# Patient Record
Sex: Female | Born: 1975 | Hispanic: No | Marital: Single | Smoking: Current every day smoker
Health system: Southern US, Community
[De-identification: ages and names within clinical notes are randomized; demographics above are authoritative.]

## PROBLEM LIST (undated history)

## (undated) DIAGNOSIS — S8262XA Displaced fracture of lateral malleolus of left fibula, initial encounter for closed fracture: Secondary | ICD-10-CM

## (undated) DIAGNOSIS — F17200 Nicotine dependence, unspecified, uncomplicated: Secondary | ICD-10-CM

## (undated) HISTORY — PX: BREAST SURGERY: SHX581

---

## 2016-10-29 ENCOUNTER — Emergency Department (HOSPITAL_COMMUNITY)
Admission: EM | Admit: 2016-10-29 | Discharge: 2016-10-29 | Disposition: A | Discharge status: Home / Self Care | Payer: Medicaid Other | Attending: Emergency Medicine | Admitting: Emergency Medicine

## 2016-10-29 ENCOUNTER — Encounter (HOSPITAL_COMMUNITY): Payer: Self-pay | Admitting: Emergency Medicine

## 2016-10-29 DIAGNOSIS — N61 Mastitis without abscess: Secondary | ICD-10-CM | POA: Insufficient documentation

## 2016-10-29 DIAGNOSIS — F17200 Nicotine dependence, unspecified, uncomplicated: Secondary | ICD-10-CM | POA: Diagnosis not present

## 2016-10-29 MED ORDER — SULFAMETHOXAZOLE-TRIMETHOPRIM 800-160 MG PO TABS
1.0000 | ORAL_TABLET | Freq: Once | ORAL | Status: AC
  Administered 2016-10-29: 1 via ORAL
  Filled 2016-10-29: qty 1

## 2016-10-29 MED ORDER — SULFAMETHOXAZOLE-TRIMETHOPRIM 800-160 MG PO TABS: 1.0000 | ORAL_TABLET | Freq: Two times a day (BID) | ORAL | 0 refills | Status: AC

## 2016-10-29 MED ORDER — CEPHALEXIN 500 MG PO CAPS: 500.0000 mg | ORAL_CAPSULE | Freq: Two times a day (BID) | ORAL | 0 refills | Status: DC

## 2016-10-29 MED ORDER — CEPHALEXIN 500 MG PO CAPS
500.0000 mg | ORAL_CAPSULE | Freq: Once | ORAL | Status: AC
  Administered 2016-10-29: 500 mg via ORAL
  Filled 2016-10-29: qty 1

## 2016-10-29 NOTE — Discharge Instructions (Signed)
PLEASE SCHEDULE A FOLLOW UP APPOINTMENT WITH [facility] AS SOON AS POSSIBLE. TAKE ALL ANTIBIOTICS AS PRESCRIBED UNTIL THEY ARE FINISHED. RETURN IMMEDIATELY FOR ANY FEVERS, WORSENING SWELLING OR PAIN.

## 2016-10-29 NOTE — ED Provider Notes (Signed)
[facility]-EMERGENCY DEPT Provider Note   CSN: [id number] Arrival date & time: 10/29/16  1114     History   Chief Complaint Chief Complaint  Patient presents with  . breast cyst    HPI Sandra Pineda is a 40 y.o. female.  40 year old female who presents with right breast pain and redness. Patient reports that 12 years ago she had a tubal ligation which she thinks caused her to have problems with cysts in her breasts. She has refused to have mammograms because she thinks the machines will cause more harm to her body. She had surgery to remove cyst on R breast 12 years ago. For the past 7-8 days, she has had warmth, redness, swelling, drainage, and pain of R breast near previous surgical incision. The swelling has improved but she continues to have moderate, constant pain. Subjective sweats and chills, no vomiting or measured fevers. She states "the molecules in my body are off and I haven't been able to heal this holistically on my own. I have taken garlic without improvement."    The history is provided by the patient.    History reviewed. No pertinent past medical history.  There are no active problems to display for this patient.   Past Surgical History:  Procedure Laterality Date  . BREAST SURGERY      OB History    No data available       Home Medications    Prior to Admission medications   Medication Sig Start Date End Date Taking? Authorizing Provider  megestrol (MEGACE) 40 MG/ML suspension Take 400 mg by mouth daily.   Yes Historical Provider, MD  cephALEXin (KEFLEX) 500 MG capsule Take 1 capsule (500 mg total) by mouth 2 (two) times daily. 10/29/16   Laurence Spatesachel Morgan Abie Cheek, MD  sulfamethoxazole-trimethoprim (BACTRIM DS,SEPTRA DS) 800-160 MG tablet Take 1 tablet by mouth 2 (two) times daily. 10/29/16 11/05/16  Laurence Spatesachel Morgan Gaylyn Berish, MD    Family History No family history on file.  Social History Social History  Substance Use Topics  . Smoking status: Current  Every Day Smoker  . Smokeless tobacco: Never Used  . Alcohol use No     Allergies   Patient has no known allergies.   Review of Systems Review of Systems 10 Systems reviewed and are negative for acute change except as noted in the HPI.   Physical Exam Updated Vital Signs BP 145/79 (BP Location: Left Arm)   Pulse 75   Temp 98.6 F (37 C)   Resp 18   LMP 10/01/2016   SpO2 97%   Physical Exam  Constitutional: She is oriented to person, place, and time. She appears well-developed and well-nourished. No distress.  HENT:  Head: Normocephalic and atraumatic.  Moist mucous membranes  Eyes: Conjunctivae are normal. Pupils are equal, round, and reactive to light.  Neck: Neck supple.  Cardiovascular: Normal rate, regular rhythm and normal heart sounds.   No murmur heard. Pulmonary/Chest: Effort normal and breath sounds normal.  Abdominal: Soft. Bowel sounds are normal. She exhibits no distension. There is no tenderness.  Musculoskeletal: She exhibits no edema.  Neurological: She is alert and oriented to person, place, and time.  Fluent speech  Skin: Skin is warm and dry. There is erythema.  Small 1cm scar on R breast areola at 9-10 o'clock position, with small area of surrounding redness, mild induration, tenderness; overlying scab on R areola with no active drainage; no fluctuance  Psychiatric:  Bizarre affect, slightly pressured speech +delusions  Nursing note  and vitals reviewed.  Chaperone was present during exam.   ED Treatments / Results  Labs (all labs ordered are listed, but only abnormal results are displayed) Labs Reviewed - No data to display  EKG  EKG Interpretation None       Radiology No results found.  Procedures Procedures (including critical care time)  Medications Ordered in ED Medications  cephALEXin (KEFLEX) capsule 500 mg (not administered)  sulfamethoxazole-trimethoprim (BACTRIM DS,SEPTRA DS) 800-160 MG per tablet 1 tablet (not  administered)     Initial Impression / Assessment and Plan / ED Course  I have reviewed the triage vital signs and the nursing notes.  Pertinent labs & imaging results that were available during my care of the patient were reviewed by me and considered in my medical decision making (see chart for details).  Clinical Course    PT w/ 1 week of R breast redness, drainage, and pain near area of previous cyst removal 12 years ago. Well-appearing, afebrile, and with reassuring vital signs on exam. Small area of erythema, scabbing from previous drainage but no active drainage on exam. No focal area of fluctuance to suggest underlying abscess. Exam is consistent with cellulitic process and I do not feel she needs drainage at this point in time. I have however recommended that she follow closely with Gen. surgery and provided her with follow-up information, especially given report of previous surgery to breast. Started on Keflex and Bactrim and emphasized importance of return precautions including any worsening swelling, drainage, or fevers. Patient voiced understanding of plan and was discharged in satisfactory condition.  Final Clinical Impressions(s) / ED Diagnoses   Final diagnoses:  Cellulitis of right breast    New Prescriptions New Prescriptions   CEPHALEXIN (KEFLEX) 500 MG CAPSULE    Take 1 capsule (500 mg total) by mouth 2 (two) times daily.   SULFAMETHOXAZOLE-TRIMETHOPRIM (BACTRIM DS,SEPTRA DS) 800-160 MG TABLET    Take 1 tablet by mouth 2 (two) times daily.     Laurence Spatesachel Morgan Latifa Noble, MD 10/29/16 [id number]

## 2016-10-29 NOTE — ED Notes (Addendum)
Patient has area of redness without swelling to right breast at approximately 10 o'clock.  Patient states area has drained from old incision site for several days, but no drainage noted currently.  Patient denies N/V, but endorses occasional chills.

## 2016-10-29 NOTE — ED Notes (Signed)
Pt. Refuse to get in a gown. Nurse aware.

## 2016-10-29 NOTE — ED Triage Notes (Signed)
Pt reports breast cyst removal 12 years ago, presents with rednss and possible infection left breast. sts painful to touch. Also reports visible drainage x 7 days.

## 2016-11-02 ENCOUNTER — Inpatient Hospital Stay: Admission: RE | Admit: 2016-11-02 | Payer: Self-pay | Source: Ambulatory Visit

## 2016-11-02 ENCOUNTER — Other Ambulatory Visit: Payer: Self-pay | Admitting: Surgery

## 2016-11-02 DIAGNOSIS — N61 Mastitis without abscess: Secondary | ICD-10-CM

## 2016-11-03 ENCOUNTER — Inpatient Hospital Stay: Admission: RE | Admit: 2016-11-03 | Payer: Self-pay | Source: Ambulatory Visit

## 2016-11-03 ENCOUNTER — Ambulatory Visit (HOSPITAL_COMMUNITY): Payer: Self-pay

## 2018-03-30 ENCOUNTER — Ambulatory Visit: Payer: Self-pay | Admitting: Obstetrics

## 2018-04-05 ENCOUNTER — Ambulatory Visit: Payer: Self-pay | Admitting: Obstetrics

## 2018-04-11 ENCOUNTER — Ambulatory Visit: Payer: Self-pay | Admitting: Obstetrics

## 2018-05-30 ENCOUNTER — Ambulatory Visit (INDEPENDENT_AMBULATORY_CARE_PROVIDER_SITE_OTHER): Payer: Medicaid Other

## 2018-05-30 ENCOUNTER — Encounter (HOSPITAL_COMMUNITY): Payer: Self-pay

## 2018-05-30 ENCOUNTER — Ambulatory Visit (HOSPITAL_COMMUNITY)
Admission: EM | Admit: 2018-05-30 | Discharge: 2018-05-30 | Disposition: A | Discharge status: Home / Self Care | Payer: Medicaid Other | Attending: Family Medicine | Admitting: Family Medicine

## 2018-05-30 DIAGNOSIS — W450XXA Nail entering through skin, initial encounter: Secondary | ICD-10-CM | POA: Diagnosis not present

## 2018-05-30 DIAGNOSIS — Z23 Encounter for immunization: Secondary | ICD-10-CM

## 2018-05-30 DIAGNOSIS — S99921A Unspecified injury of right foot, initial encounter: Secondary | ICD-10-CM

## 2018-05-30 MED ORDER — TETANUS-DIPHTH-ACELL PERTUSSIS 5-2.5-18.5 LF-MCG/0.5 IM SUSP
0.5000 mL | Freq: Once | INTRAMUSCULAR | Status: AC
  Administered 2018-05-30: 0.5 mL via INTRAMUSCULAR

## 2018-05-30 MED ORDER — DICLOFENAC SODIUM 75 MG PO TBEC: 75.0000 mg | DELAYED_RELEASE_TABLET | Freq: Two times a day (BID) | ORAL | 0 refills | Status: DC

## 2018-05-30 MED ORDER — TETANUS-DIPHTH-ACELL PERTUSSIS 5-2.5-18.5 LF-MCG/0.5 IM SUSP
INTRAMUSCULAR | Status: AC
  Filled 2018-05-30: qty 0.5

## 2018-05-30 NOTE — ED Provider Notes (Signed)
[facility]   [id number] 05/30/18 Arrival Time: 1045  ASSESSMENT & PLAN:  1. Foot injury, right, initial encounter    Imaging: Dg Foot Complete Right  Result Date: 05/30/2018 CLINICAL DATA:  Stepped on a nail 3 days ago, puncture wound to plantar aspect of foot EXAM: RIGHT FOOT COMPLETE - 3+ VIEW COMPARISON:  None FINDINGS: Osseous mineralization normal. Joint spaces preserved. No acute fracture, dislocation, or bone destruction. No radiopaque foreign bodies or soft tissue gas identified. IMPRESSION: Normal exam. Electronically Signed   By: Ulyses Southward M.D.   On: 05/30/2018 12:02    Meds ordered this encounter  Medications  . Tdap (BOOSTRIX) injection 0.5 mL  . diclofenac (VOLTAREN) 75 MG EC tablet    Sig: Take 1 tablet (75 mg total) by mouth 2 (two) times daily.    Dispense:  14 tablet    Refill:  0   Natural history and expected course discussed. Questions answered. NSAIDs per medication orders.  May f/u with her PCP or here as needed.  Reviewed expectations re: course of current medical issues. Questions answered. Outlined signs and symptoms indicating need for more acute intervention. Patient verbalized understanding. After Visit Summary given.  SUBJECTIVE: History from: patient. Gelila Well is a 42 y.o. female who reports persistent mild pain of her right plantar foot that is stable; described as aching without radiation. Onset: abrupt, two days ago. Injury/trama: yes, reports stepping on nail two days ago; through slipper; minimal bleeding. Relieved by: elevating foot. Worsened by: weight bearing. Associated symptoms: none reported. Extremity sensation changes or weakness: none. Self treatment: two left over Tramadol she had; helped History of similar: no  Td: unsure  ROS: As per HPI.   OBJECTIVE:  Vitals:   05/30/18 1130 05/30/18 1132  BP:  129/82  Pulse:  71  Resp:  20  Temp: 98.3 F (36.8 C)   TempSrc: Temporal   SpO2:  98%      General appearance: alert; no distress Extremities: warm and well perfused; symmetrical with no gross deformities; localized tenderness over her right plantar mid-foot with no swelling and no bruising; ROM: normal; no sign of infection; skin soft to palpation CV: normal extremity capillary refill Skin: warm and dry Neurologic: normal gait; normal sensation in all extremities Psychological: alert and cooperative; normal mood and affect  No Known Allergies   Social History   Socioeconomic History  . Marital status: Single    Spouse name: Not on file  . Number of children: Not on file  . Years of education: Not on file  . Highest education level: Not on file  Occupational History  . Not on file  Social Needs  . Financial resource strain: Not on file  . Food insecurity:    Worry: Not on file    Inability: Not on file  . Transportation needs:    Medical: Not on file    Non-medical: Not on file  Tobacco Use  . Smoking status: Current Every Day Smoker  . Smokeless tobacco: Never Used  Substance and Sexual Activity  . Alcohol use: No  . Drug use: Not on file  . Sexual activity: Not on file  Lifestyle  . Physical activity:    Days per week: Not on file    Minutes per session: Not on file  . Stress: Not on file  Relationships  . Social connections:    Talks on phone: Not on file    Gets together: Not on file    Attends  religious service: Not on file    Active member of club or organization: Not on file    Attends meetings of clubs or organizations: Not on file    Relationship status: Not on file  . Intimate partner violence:    Fear of current or ex partner: Not on file    Emotionally abused: Not on file    Physically abused: Not on file    Forced sexual activity: Not on file  Other Topics Concern  . Not on file  Social History Narrative  . Not on file   History reviewed. No pertinent family history. Past Surgical History:  Procedure Laterality Date  . BREAST  SURGERY        Mardella LaymanHagler, Leon Goodnow, MD 06/09/18 [phone]

## 2018-05-30 NOTE — Discharge Instructions (Addendum)
Medications  Tdap (BOOSTRIX) injection 0.5 mL    We have given you a tetanus shot today.  Take the medication prescribed with food for your foot discomfort.

## 2018-05-30 NOTE — ED Triage Notes (Signed)
Pt presents with right foot pain from stepping on a nail.

## 2018-11-10 ENCOUNTER — Emergency Department (HOSPITAL_COMMUNITY)
Admission: EM | Admit: 2018-11-10 | Discharge: 2018-11-10 | Disposition: A | Discharge status: Home / Self Care | Payer: Medicaid Other | Attending: Emergency Medicine | Admitting: Emergency Medicine

## 2018-11-10 ENCOUNTER — Encounter (HOSPITAL_COMMUNITY): Payer: Self-pay | Admitting: Emergency Medicine

## 2018-11-10 ENCOUNTER — Emergency Department (HOSPITAL_COMMUNITY): Payer: Medicaid Other

## 2018-11-10 ENCOUNTER — Other Ambulatory Visit: Payer: Self-pay

## 2018-11-10 DIAGNOSIS — F17200 Nicotine dependence, unspecified, uncomplicated: Secondary | ICD-10-CM | POA: Diagnosis not present

## 2018-11-10 DIAGNOSIS — S82832A Other fracture of upper and lower end of left fibula, initial encounter for closed fracture: Secondary | ICD-10-CM | POA: Insufficient documentation

## 2018-11-10 DIAGNOSIS — Y999 Unspecified external cause status: Secondary | ICD-10-CM | POA: Diagnosis not present

## 2018-11-10 DIAGNOSIS — Z79899 Other long term (current) drug therapy: Secondary | ICD-10-CM | POA: Insufficient documentation

## 2018-11-10 DIAGNOSIS — W108XXA Fall (on) (from) other stairs and steps, initial encounter: Secondary | ICD-10-CM | POA: Diagnosis not present

## 2018-11-10 DIAGNOSIS — Y92008 Other place in unspecified non-institutional (private) residence as the place of occurrence of the external cause: Secondary | ICD-10-CM | POA: Diagnosis not present

## 2018-11-10 DIAGNOSIS — W19XXXA Unspecified fall, initial encounter: Secondary | ICD-10-CM

## 2018-11-10 DIAGNOSIS — S8992XA Unspecified injury of left lower leg, initial encounter: Secondary | ICD-10-CM | POA: Diagnosis present

## 2018-11-10 DIAGNOSIS — Y9301 Activity, walking, marching and hiking: Secondary | ICD-10-CM | POA: Insufficient documentation

## 2018-11-10 MED ORDER — OXYCODONE-ACETAMINOPHEN 5-325 MG PO TABS
1.0000 | ORAL_TABLET | Freq: Once | ORAL | Status: DC
  Filled 2018-11-10: qty 1

## 2018-11-10 MED ORDER — OXYCODONE-ACETAMINOPHEN 5-325 MG PO TABS: 1.0000 | ORAL_TABLET | Freq: Four times a day (QID) | ORAL | 0 refills | Status: DC | PRN

## 2018-11-10 NOTE — ED Triage Notes (Signed)
Per EMS, patient from home, reports trip and fall yesterday. C/o left ankle pain. Swelling noted to ankle.

## 2018-11-10 NOTE — ED Provider Notes (Signed)
[facility]-EMERGENCY DEPT Provider Note   CSN: [id number] Arrival date & time: 11/10/18  1515     History   Chief Complaint Chief Complaint  Patient presents with  . Ankle Pain    HPI Sandra Pineda is a 42 y.o. female with a history of tobacco abuse presents to the emergency department via EMS status post mechanical fall last evening with complaints of left ankle pain primarily.  Patient states that she was walking down her porch and missed the last step resulting in a fall.  She believes that she rolled the left ankle.  She did fall to the ground but did not hit her head or have any loss of consciousness.  States she is having pain to bilateral ankles, most prominently to the left ankle w/ distal lower leg pain and foot pain to the LLE as well.  She states that she has been putting her all her weight on the right foot secondary to pain in the LLE.  She has had associated swelling.  Current discomfort is severe.  She took 800 mg ibuprofen without relief.  She denies numbness, tingling, weakness, or other areas of injury.  HPI  History reviewed. No pertinent past medical history.  There are no active problems to display for this patient.   Past Surgical History:  Procedure Laterality Date  . BREAST SURGERY       OB History   No obstetric history on file.      Home Medications    Prior to Admission medications   Medication Sig Start Date End Date Taking? Authorizing Provider  cephALEXin (KEFLEX) 500 MG capsule Take 1 capsule (500 mg total) by mouth 2 (two) times daily. 10/29/16   Little, Ambrose Finland, MD  diclofenac (VOLTAREN) 75 MG EC tablet Take 1 tablet (75 mg total) by mouth 2 (two) times daily. 05/30/18   Mardella Layman, MD  megestrol (MEGACE) 40 MG/ML suspension Take 400 mg by mouth daily.    [provider]    Family History No family history on file.  Social History Social History   Tobacco Use  . Smoking status: Current  Every Day Smoker  . Smokeless tobacco: Never Used  Substance Use Topics  . Alcohol use: No  . Drug use: Not on file     Allergies   Patient has no known allergies.   Review of Systems Review of Systems  Constitutional: Negative for chills and fever.  Musculoskeletal: Positive for arthralgias (ankles bilaterally) and joint swelling (ankles bilaterally). Negative for back pain and neck pain.  Skin: Negative for wound.  Neurological: Negative for weakness and numbness.  All other systems reviewed and are negative.    Physical Exam Updated Vital Signs There were no vitals taken for this visit.  Physical Exam Vitals signs and nursing note reviewed.  Constitutional:      General: She is not in acute distress.    Appearance: She is well-developed. She is not toxic-appearing.  HENT:     Head: Normocephalic and atraumatic. No raccoon eyes or Battle's sign.  Eyes:     General:        Right eye: No discharge.        Left eye: No discharge.     Conjunctiva/sclera: Conjunctivae normal.  Neck:     Musculoskeletal: Normal range of motion and neck supple. No spinous process tenderness.  Cardiovascular:     Rate and Rhythm: Normal rate and regular rhythm.     Pulses:  Dorsalis pedis pulses are 2+ on the right side and 2+ on the left side.       Posterior tibial pulses are 2+ on the right side and 2+ on the left side.  Pulmonary:     Effort: Pulmonary effort is normal. No respiratory distress.     Breath sounds: Normal breath sounds. No wheezing, rhonchi or rales.  Abdominal:     General: There is no distension.     Palpations: Abdomen is soft.     Tenderness: There is no abdominal tenderness.  Musculoskeletal:     Comments: Upper extremities: Normal range of motion.  Nontender Back: No midline tenderness or palpable step-off Lower extremities: Patient has significant soft tissue swelling to the left ankle which extends to the distal third of the lower leg and into the  midfoot.  The area is mildly erythematous.  It is not hot to touch.  No significant ecchymosis or open wounds.  Right ankle with mild soft tissue swelling over the lateral aspect of the ankle.  Patient has normal active range of motion to the hips and knees.  She has normal active range of motion to the right ankle.  Left ankle range of motion is limited secondary to pain, able to minimally dorsi/plantar flex.  LLE: Patient is tender over the lateral distal third of the lower leg, lateral malleolus, lateral ankle medical ligaments, the medial malleolus and medial ankle ligaments, as well as the midfoot.  Otherwise nontender.  No fibular head tenderness.  Neurovascularly intact distally. RLE: Tender over the lateral malleolus and lateral ankle ligaments.  No other tenderness noted.  No tenderness at the base the fifth, navicular bone, or the fibular head.  Skin:    General: Skin is warm and dry.     Findings: No rash.  Neurological:     Mental Status: She is alert.     Comments: Clear speech.  Sensation grossly intact bilateral lower extremities.  Right ankle plantar/dorsiflexion with good strength, left ankle difficult to evaluate secondary to pain.  Psychiatric:        Behavior: Behavior normal.      ED Treatments / Results  Labs (all labs ordered are listed, but only abnormal results are displayed) Labs Reviewed - No data to display  EKG None  Radiology Dg Tibia/fibula Left  Result Date: 11/10/2018 CLINICAL DATA:  Larey SeatFell yesterday with pain and swelling EXAM: LEFT TIBIA AND FIBULA - 2 VIEW COMPARISON:  None. FINDINGS: The left tibia is intact and normally aligned. However, there is an oblique fracture of the distal left fibula which is minimally displaced. IMPRESSION: Minimally displaced oblique fracture of the distal left fibula. Electronically Signed   By: Dwyane DeePaul  Barry M.D.   On: 11/10/2018 17:10   Dg Ankle Complete Left  Result Date: 11/10/2018 CLINICAL DATA:  Larey SeatFell yesterday with pain  and swelling EXAM: LEFT ANKLE COMPLETE - 3+ VIEW COMPARISON:  None. FINDINGS: There is an oblique minimally displaced fracture of the distal left fibula with adjacent soft tissue swelling present. On the images obtained, it is difficult to exclude a fracture of the posterior malleolus. CT of the left ankle may be helpful if further assessment is warranted. The calcaneus appears intact. IMPRESSION: 1. Minimally displaced oblique fracture of the distal left fibula. 2. Can not exclude nondisplaced fracture of the posterior malleolus. Consider CT of the ankle if warranted. Electronically Signed   By: Dwyane DeePaul  Barry M.D.   On: 11/10/2018 17:12   Dg Ankle Complete Right  Result Date: 11/10/2018 CLINICAL DATA:  Larey Seat yesterday with ankle pain and swelling EXAM: RIGHT ANKLE - COMPLETE 3+ VIEW COMPARISON:  None. FINDINGS: The right ankle joint appears normal with normal alignment and normal tibiotalar articulation. No acute fracture is seen. No significant soft tissue swelling is noted. IMPRESSION: Negative. Electronically Signed   By: Dwyane Dee M.D.   On: 11/10/2018 17:09   Dg Foot Complete Left  Result Date: 11/10/2018 CLINICAL DATA:  42 y.o female. Per EMS, patient from home, reports trip and fall yesterday after missing a step. C/o left and right ankle pain. Swelling noted to left ankle and bruising noted to lateral right malleolus swelling EXAM: LEFT FOOT - COMPLETE 3+ VIEW COMPARISON:  None. FINDINGS: No fracture or dislocation of mid foot or forefoot. The phalanges are normal. The calcaneus is normal. No soft tissue abnormality. Fracture of the distal fibula. IMPRESSION: 1. No foot fracture. 2. Distal fibular fracture.  See ankle film dictation. Electronically Signed   By: Genevive Bi M.D.   On: 11/10/2018 17:09    Procedures Procedures (including critical care time)  SPLINT APPLICATION Date/Time: 6:34 PM Authorized by: Harvie Heck Consent: Verbal consent obtained. Risks and benefits:  risks, benefits and alternatives were discussed Consent given by: patient Splint applied by: orthopedic technician Location details: LLE Splint type: short leg splint Post-procedure: The splinted body part was neurovascularly unchanged following the procedure. Patient tolerance: Patient tolerated the procedure well with no immediate complications.  SPLINT APPLICATION Date/Time: 6:34 PM Authorized by: Harvie Heck Consent: Verbal consent obtained. Risks and benefits: risks, benefits and alternatives were discussed Consent given by: patient Splint applied by: orthopedic technician Location details: RLE Splint type: ASO Post-procedure: The splinted body part was neurovascularly unchanged following the procedure. Patient tolerance: Patient tolerated the procedure well with no immediate complications.    Medications Ordered in ED Medications - No data to display  Initial Impression / Assessment and Plan / ED Course  I have reviewed the triage vital signs and the nursing notes.  Pertinent labs & imaging results that were available during my care of the patient were reviewed by me and considered in my medical decision making (see chart for details).   Patient presents to the ED s/p mechanical fall last night w/ complaints of bilateral ankle pain L significantly > R. Nontoxic, vitals WNL. No evidence of serious head/neck/back injury related to fall. Imaging obtained in areas of tenderness.  RLE ankle xray negative- will place in ASO for support. LLE films with minimally displaced oblique fx of the distal fibula, cannot exclude nondisplaced posterior malleolus fracture. Per radiology consider CT of ankle if warranted. Patient NVI distally in both lower extremities. Will further discuss with orthopedics.   18:25: CONSULT: Discussed case with orthopedic surgeon Dr. Roda Shutters- instructs CT not necessary, short leg splint, non weightbearing- patient to call office tomorrow AM for follow up and  planning of surgical repair.   Plan followed through as discussed. Patient has 800 mg ibuprofen at home I recommend she continue this. Will give short course of percocet for pain. [place] Controlled Substance reporting System queried. PRICE. I discussed results, treatment plan, need for follow-up, and return precautions with the patient. Provided opportunity for questions, patient confirmed understanding and is in agreement with plan.   Findings and plan of care discussed with supervising physician Dr. Effie Shy who is in agreement.    Final Clinical Impressions(s) / ED Diagnoses   Final diagnoses:  Closed fracture of distal end of left fibula,  unspecified fracture morphology, initial encounter  Fall, initial encounter    ED Discharge Orders         Ordered    oxyCODONE-acetaminophen (PERCOCET/ROXICET) 5-325 MG tablet  Every 6 hours PRN     11/10/18 1841           Cherly Anderson, PA-C 11/10/18 1928    Mancel Bale, MD 11/11/18 1302

## 2018-11-10 NOTE — Discharge Instructions (Signed)
Please read and follow all provided instructions.  You have been seen today for a fall with ankle pain.  Your right ankle x-ray was normal, we have given you a brace to help stabilize this as it may be sprain.  Your left leg showed that you have a fracture in your fibula (1 of the bones in the lower leg).  Placed the left leg into a splint, you need to remain in the splint and keep it dry until you have followed up with orthopedics.  You are not to bear weight on the left leg due to the fracture.  Please use the crutches provided.  Please call Dr. Roda ShuttersXu to make an appointment within the next 3 days for further management and reevaluation.  Home care instructions: -- *PRICE in the first 24-48 hours after injury: Protect (with brace, splint, sling), if given by your provider Rest Ice- Do not apply ice pack directly to your splint, place towel or similar between your splint and ice/ice pack. Apply ice for 20 min, then remove for 40 min while awake Compression- Wear brace, elastic bandage, splint as directed by your provider Elevate affected extremity above the level of your heart when not walking around for the first 24-48 hours   Medications:  Please take your 800 mg ibuprofen at home continuously to help with pain and swelling.  Take Percocet for severe pain not alleviated by ibuprofen. -Percocet-this is a narcotic/controlled substance medication that has potential addicting qualities.  We recommend that you take 1-2 tablets every 6 hours as needed for severe pain.  Do not drive or operate heavy machinery when taking this medicine as it can be sedating. Do not drink alcohol or take other sedating medications when taking this medicine for safety reasons.  Keep this out of reach of small children.  Please be aware this medicine has Tylenol in it (325 mg/tab) do not exceed the maximum dose of Tylenol in a day per over the counter recommendations should you decide to supplement with Tylenol over the counter.    We have prescribed you new medication(s) today. Discuss the medications prescribed today with your pharmacist as they can have adverse effects and interactions with your other medicines including over the counter and prescribed medications. Seek medical evaluation if you start to experience new or abnormal symptoms after taking one of these medicines, seek care immediately if you start to experience difficulty breathing, feeling of your throat closing, facial swelling, or rash as these could be indications of a more serious allergic reaction   Follow-up instructions: Follow up with Dr. Warren DanesXu's office- call tomorrow morning to make an appointment for within the next 3 days.   Return instructions:  Please return if your digits or extremity are numb or tingling, appear gray or blue, or you have severe pain (also elevate the extremity and loosen splint or wrap if you were given one) Please return if you have redness or fevers.  Please return to the Emergency Department if you experience worsening symptoms.  Please return if you have any other emergent concerns. Additional Information:  Your vital signs today were: BP (!) 151/73 (BP Location: Left Arm)    Pulse 72    Temp 98.2 F (36.8 C) (Oral)    Resp 19    SpO2 95%  If your blood pressure (BP) was elevated above 135/85 this visit, please have this repeated by your doctor within one month. ---------------

## 2018-11-10 NOTE — ED Notes (Signed)
Bed: WTR6 Expected date:  Expected time:  Means of arrival:  Comments: EMS 42yo fall last night ankle pain

## 2018-11-10 NOTE — ED Notes (Signed)
Patient refusing pain medication at this time. Requesting to take medication with food. Awaiting XR results.

## 2018-11-10 NOTE — ED Notes (Signed)
Patient transported to X-ray 

## 2018-11-10 NOTE — ED Notes (Signed)
Patient refused pain medication. Patient given crackers and offered sandwich to take with medicine. Patient reports "I have them at home, I will just take one when I get home."

## 2018-11-10 NOTE — ED Notes (Signed)
Patient provided with female urinal. 

## 2018-11-15 ENCOUNTER — Ambulatory Visit (INDEPENDENT_AMBULATORY_CARE_PROVIDER_SITE_OTHER): Payer: Medicaid Other | Admitting: Orthopaedic Surgery

## 2018-11-15 ENCOUNTER — Other Ambulatory Visit: Payer: Self-pay

## 2018-11-15 ENCOUNTER — Encounter (HOSPITAL_BASED_OUTPATIENT_CLINIC_OR_DEPARTMENT_OTHER): Payer: Self-pay | Admitting: *Deleted

## 2018-11-15 DIAGNOSIS — S8262XA Displaced fracture of lateral malleolus of left fibula, initial encounter for closed fracture: Secondary | ICD-10-CM

## 2018-11-15 MED ORDER — METHOCARBAMOL 500 MG PO TABS: 500.0000 mg | ORAL_TABLET | Freq: Four times a day (QID) | ORAL | 2 refills | Status: AC | PRN

## 2018-11-15 NOTE — Progress Notes (Signed)
Office Visit Note   Patient: Sandra Pineda           Date of Birth: 1976-05-04           MRN: [id number] Visit Date: 11/15/2018              Requested by: [facility] [address] [place], [place] [address] PCP: [facility]   Assessment & Plan: Visit Diagnoses:  1. Closed displaced fracture of lateral malleolus of left fibula, initial encounter     Plan: Impression is displaced left Weber C ankle fracture.  These x-rays were reviewed with the patient and recommendations for surgical stabilization and evaluation of her syndesmosis.  We discussed the risks and benefits and rehab and recovery today.  She agrees to proceed with surgical treatment of her injury.  Today we placed her back into the splint she is to keep this elevated.  She denies a history of DVT.  Follow-Up Instructions: Return for 2 week postop visit.   Orders:  No orders of the defined types were placed in this encounter.  Meds ordered this encounter  Medications  . methocarbamol (ROBAXIN) 500 MG tablet    Sig: Take 1 tablet (500 mg total) by mouth every 6 (six) hours as needed for muscle spasms.    Dispense:  30 tablet    Refill:  2      Procedures: No procedures performed   Clinical Data: No additional findings.   Subjective: Chief Complaint  Patient presents with  . Left Ankle - Fracture    Sandra Pineda is a 42 year old female comes in today for an acute injury to her left ankle that she sustained on 11/10/2018.  She was at home when she missed a step and twisted her ankle.  She presented to the ED and x-rays demonstrated a displaced Weber C fibula fracture.  She comes in today for further evaluation and treatment.  She has been taking oxycodone ibuprofen for the pain and swelling.  Denies any numbness and tingling.  She comes in today with her daughter.   Review of Systems  Constitutional: Negative.   HENT: Negative.   Eyes: Negative.   Respiratory: Negative.   Cardiovascular:  Negative.   Endocrine: Negative.   Musculoskeletal: Negative.   Neurological: Negative.   Hematological: Negative.   Psychiatric/Behavioral: Negative.   All other systems reviewed and are negative.    Objective: Vital Signs: There were no vitals taken for this visit.  Physical Exam Vitals signs and nursing note reviewed.  Constitutional:      Appearance: She is well-developed.  HENT:     Head: Normocephalic and atraumatic.  Neck:     Musculoskeletal: Neck supple.  Pulmonary:     Effort: Pulmonary effort is normal.  Abdominal:     Palpations: Abdomen is soft.  Skin:    General: Skin is warm.     Capillary Refill: Capillary refill takes less than 2 seconds.  Neurological:     Mental Status: She is alert and oriented to person, place, and time.  Psychiatric:        Behavior: Behavior normal.        Thought Content: Thought content normal.        Judgment: Judgment normal.     Ortho Exam Left foot and ankle exam shows mild to moderate swelling with some mild bruising.  No neurovascular compromise.  The skin wrinkles with pinching.  She has strong distal pulses. Specialty Comments:  No specialty comments available.  Imaging: No results found.   PMFS History: Patient Active Problem List   Diagnosis Date Noted  . Closed fracture of left lateral malleolus 11/15/2018   No past medical history on file.  No family history on file.  Past Surgical History:  Procedure Laterality Date  . BREAST SURGERY     Social History   Occupational History  . Not on file  Tobacco Use  . Smoking status: Current Every Day Smoker  . Smokeless tobacco: Never Used  Substance and Sexual Activity  . Alcohol use: No  . Drug use: Not on file  . Sexual activity: Not on file

## 2018-11-16 ENCOUNTER — Other Ambulatory Visit: Payer: Self-pay

## 2018-11-16 ENCOUNTER — Ambulatory Visit (HOSPITAL_BASED_OUTPATIENT_CLINIC_OR_DEPARTMENT_OTHER): Payer: Medicaid Other | Admitting: Anesthesiology

## 2018-11-16 ENCOUNTER — Encounter (HOSPITAL_BASED_OUTPATIENT_CLINIC_OR_DEPARTMENT_OTHER)
Admission: RE | Disposition: A | Discharge status: Home / Self Care | Payer: Self-pay | Source: Home / Self Care | Attending: Orthopaedic Surgery

## 2018-11-16 ENCOUNTER — Encounter (HOSPITAL_BASED_OUTPATIENT_CLINIC_OR_DEPARTMENT_OTHER): Payer: Self-pay | Admitting: Certified Registered"

## 2018-11-16 ENCOUNTER — Ambulatory Visit (HOSPITAL_BASED_OUTPATIENT_CLINIC_OR_DEPARTMENT_OTHER)
Admission: RE | Admit: 2018-11-16 | Discharge: 2018-11-16 | Disposition: A | Discharge status: Home / Self Care | Payer: Medicaid Other | Attending: Orthopaedic Surgery | Admitting: Orthopaedic Surgery

## 2018-11-16 ENCOUNTER — Ambulatory Visit (HOSPITAL_COMMUNITY): Payer: Medicaid Other

## 2018-11-16 DIAGNOSIS — X58XXXA Exposure to other specified factors, initial encounter: Secondary | ICD-10-CM | POA: Diagnosis not present

## 2018-11-16 DIAGNOSIS — F17210 Nicotine dependence, cigarettes, uncomplicated: Secondary | ICD-10-CM | POA: Diagnosis not present

## 2018-11-16 DIAGNOSIS — Z419 Encounter for procedure for purposes other than remedying health state, unspecified: Secondary | ICD-10-CM

## 2018-11-16 DIAGNOSIS — S8262XA Displaced fracture of lateral malleolus of left fibula, initial encounter for closed fracture: Secondary | ICD-10-CM | POA: Diagnosis present

## 2018-11-16 HISTORY — DX: Nicotine dependence, unspecified, uncomplicated: F17.200

## 2018-11-16 HISTORY — PX: ORIF ANKLE FRACTURE: SHX5408

## 2018-11-16 HISTORY — DX: Displaced fracture of lateral malleolus of left fibula, initial encounter for closed fracture: S82.62XA

## 2018-11-16 LAB — POCT PREGNANCY, URINE: Preg Test, Ur: NEGATIVE

## 2018-11-16 SURGERY — OPEN REDUCTION INTERNAL FIXATION (ORIF) ANKLE FRACTURE
Anesthesia: Regional | Site: Ankle | Laterality: Left

## 2018-11-16 MED ORDER — ONDANSETRON HCL 4 MG/2ML IJ SOLN
INTRAMUSCULAR | Status: DC | PRN
  Administered 2018-11-16: 4 mg via INTRAVENOUS

## 2018-11-16 MED ORDER — SCOPOLAMINE 1 MG/3DAYS TD PT72: 1.0000 | MEDICATED_PATCH | Freq: Once | TRANSDERMAL | Status: DC | PRN

## 2018-11-16 MED ORDER — DEXAMETHASONE SODIUM PHOSPHATE 10 MG/ML IJ SOLN
INTRAMUSCULAR | Status: DC | PRN
  Administered 2018-11-16: 10 mg via INTRAVENOUS

## 2018-11-16 MED ORDER — CEFAZOLIN SODIUM-DEXTROSE 2-4 GM/100ML-% IV SOLN
INTRAVENOUS | Status: AC
  Filled 2018-11-16: qty 100

## 2018-11-16 MED ORDER — LIDOCAINE HCL (CARDIAC) PF 100 MG/5ML IV SOSY
PREFILLED_SYRINGE | INTRAVENOUS | Status: DC | PRN
  Administered 2018-11-16: 30 mg via INTRAVENOUS

## 2018-11-16 MED ORDER — MEPERIDINE HCL 25 MG/ML IJ SOLN: 6.2500 mg | INTRAMUSCULAR | Status: DC | PRN

## 2018-11-16 MED ORDER — CEFAZOLIN SODIUM-DEXTROSE 2-4 GM/100ML-% IV SOLN
2.0000 g | INTRAVENOUS | Status: AC
  Administered 2018-11-16: 2 g via INTRAVENOUS

## 2018-11-16 MED ORDER — FENTANYL CITRATE (PF) 100 MCG/2ML IJ SOLN
INTRAMUSCULAR | Status: AC
  Filled 2018-11-16: qty 2

## 2018-11-16 MED ORDER — PROPOFOL 500 MG/50ML IV EMUL
INTRAVENOUS | Status: DC | PRN
  Administered 2018-11-16: 25 ug/kg/min via INTRAVENOUS

## 2018-11-16 MED ORDER — MIDAZOLAM HCL 2 MG/2ML IJ SOLN
1.0000 mg | INTRAMUSCULAR | Status: DC | PRN
  Administered 2018-11-16 (×2): 2 mg via INTRAVENOUS

## 2018-11-16 MED ORDER — FENTANYL CITRATE (PF) 100 MCG/2ML IJ SOLN
50.0000 ug | INTRAMUSCULAR | Status: DC | PRN
  Administered 2018-11-16 (×2): 50 ug via INTRAVENOUS

## 2018-11-16 MED ORDER — PROMETHAZINE HCL 25 MG PO TABS: 25.0000 mg | ORAL_TABLET | Freq: Four times a day (QID) | ORAL | 1 refills | Status: DC | PRN

## 2018-11-16 MED ORDER — PROMETHAZINE HCL 25 MG/ML IJ SOLN: 6.2500 mg | INTRAMUSCULAR | Status: DC | PRN

## 2018-11-16 MED ORDER — CHLORHEXIDINE GLUCONATE 4 % EX LIQD: 60.0000 mL | Freq: Once | CUTANEOUS | Status: DC

## 2018-11-16 MED ORDER — ROPIVACAINE HCL 7.5 MG/ML IJ SOLN
INTRAMUSCULAR | Status: DC | PRN
  Administered 2018-11-16: 15 mL via PERINEURAL
  Administered 2018-11-16: 25 mL via PERINEURAL

## 2018-11-16 MED ORDER — LACTATED RINGERS IV SOLN
INTRAVENOUS | Status: DC
  Administered 2018-11-16 (×2): via INTRAVENOUS

## 2018-11-16 MED ORDER — FENTANYL CITRATE (PF) 100 MCG/2ML IJ SOLN: 25.0000 ug | INTRAMUSCULAR | Status: DC | PRN

## 2018-11-16 MED ORDER — DEXMEDETOMIDINE HCL IN NACL 200 MCG/50ML IV SOLN
INTRAVENOUS | Status: DC | PRN
  Administered 2018-11-16: 4 ug via INTRAVENOUS

## 2018-11-16 MED ORDER — PROPOFOL 10 MG/ML IV BOLUS
INTRAVENOUS | Status: DC | PRN
  Administered 2018-11-16: 150 mg via INTRAVENOUS

## 2018-11-16 MED ORDER — OXYCODONE HCL 5 MG PO TABS: 5.0000 mg | ORAL_TABLET | Freq: Once | ORAL | Status: DC | PRN

## 2018-11-16 MED ORDER — OXYCODONE HCL 5 MG/5ML PO SOLN: 5.0000 mg | Freq: Once | ORAL | Status: DC | PRN

## 2018-11-16 MED ORDER — OXYCODONE-ACETAMINOPHEN 5-325 MG PO TABS: 1.0000 | ORAL_TABLET | Freq: Four times a day (QID) | ORAL | 0 refills | Status: DC | PRN

## 2018-11-16 MED ORDER — MIDAZOLAM HCL 2 MG/2ML IJ SOLN
INTRAMUSCULAR | Status: AC
  Filled 2018-11-16: qty 2

## 2018-11-16 MED ORDER — LACTATED RINGERS IV SOLN: INTRAVENOUS | Status: DC

## 2018-11-16 MED ORDER — CLONIDINE HCL (ANALGESIA) 100 MCG/ML EP SOLN
EPIDURAL | Status: DC | PRN
  Administered 2018-11-16: 30 ug
  Administered 2018-11-16: 50 ug

## 2018-11-16 MED ORDER — ASPIRIN EC 81 MG PO TBEC: 81.0000 mg | DELAYED_RELEASE_TABLET | Freq: Two times a day (BID) | ORAL | 0 refills | Status: DC

## 2018-11-16 MED ORDER — OXYCODONE HCL ER 10 MG PO T12A: 10.0000 mg | EXTENDED_RELEASE_TABLET | Freq: Two times a day (BID) | ORAL | 0 refills | Status: AC

## 2018-11-16 SURGICAL SUPPLY — 88 items
BANDAGE ACE 4X5 VEL STRL LF (GAUZE/BANDAGES/DRESSINGS) ×3 IMPLANT
BANDAGE ACE 6X5 VEL STRL LF (GAUZE/BANDAGES/DRESSINGS) ×3 IMPLANT
BANDAGE ESMARK 6X9 LF (GAUZE/BANDAGES/DRESSINGS) ×1 IMPLANT
BIT DRILL OVR 3.5AO QC SHRT SM (DRILL) ×1 IMPLANT
BIT DRILL QC 2.5MM SHRT EVO SM (DRILL) ×1 IMPLANT
BLADE HEX COATED 2.75 (ELECTRODE) ×3 IMPLANT
BLADE SURG 15 STRL LF DISP TIS (BLADE) ×2 IMPLANT
BLADE SURG 15 STRL SS (BLADE) ×4
BNDG COHESIVE 4X5 TAN STRL (GAUZE/BANDAGES/DRESSINGS) ×3 IMPLANT
BNDG COHESIVE 6X5 TAN STRL LF (GAUZE/BANDAGES/DRESSINGS) IMPLANT
BNDG ESMARK 6X9 LF (GAUZE/BANDAGES/DRESSINGS) ×3
BRUSH SCRUB EZ PLAIN DRY (MISCELLANEOUS) ×3 IMPLANT
CANISTER SUCT 1200ML W/VALVE (MISCELLANEOUS) IMPLANT
COVER BACK TABLE 60X90IN (DRAPES) ×3 IMPLANT
COVER MAYO STAND STRL (DRAPES) ×3 IMPLANT
COVER WAND RF STERILE (DRAPES) IMPLANT
CUFF TOURNIQUET SINGLE 34IN LL (TOURNIQUET CUFF) ×3 IMPLANT
DECANTER SPIKE VIAL GLASS SM (MISCELLANEOUS) IMPLANT
DRAPE C-ARM 42X72 X-RAY (DRAPES) ×3 IMPLANT
DRAPE C-ARMOR (DRAPES) ×3 IMPLANT
DRAPE EXTREMITY T 121X128X90 (DRAPE) ×3 IMPLANT
DRAPE IMP U-DRAPE 54X76 (DRAPES) ×3 IMPLANT
DRAPE SURG 17X23 STRL (DRAPES) ×3 IMPLANT
DRILL OVER 3.5 AO QC SHORT SM (DRILL) ×3
DRILL QC 2.5MM SHORT EVOS SM (DRILL) ×3
DRSG PAD ABDOMINAL 8X10 ST (GAUZE/BANDAGES/DRESSINGS) ×6 IMPLANT
DURAPREP 26ML APPLICATOR (WOUND CARE) ×3 IMPLANT
ELECT REM PT RETURN 9FT ADLT (ELECTROSURGICAL) ×3
ELECTRODE REM PT RTRN 9FT ADLT (ELECTROSURGICAL) ×1 IMPLANT
GAUZE SPONGE 4X4 12PLY STRL (GAUZE/BANDAGES/DRESSINGS) ×3 IMPLANT
GAUZE XEROFORM 1X8 LF (GAUZE/BANDAGES/DRESSINGS) ×3 IMPLANT
GLOVE BIOGEL PI IND STRL 7.0 (GLOVE) ×3 IMPLANT
GLOVE BIOGEL PI INDICATOR 7.0 (GLOVE) ×6
GLOVE ECLIPSE 6.5 STRL STRAW (GLOVE) ×3 IMPLANT
GLOVE ECLIPSE 7.0 STRL STRAW (GLOVE) ×3 IMPLANT
GLOVE SKINSENSE NS SZ7.5 (GLOVE) ×2
GLOVE SKINSENSE STRL SZ7.5 (GLOVE) ×1 IMPLANT
GLOVE SURG SYN 7.5  E (GLOVE) ×2
GLOVE SURG SYN 7.5 E (GLOVE) ×1 IMPLANT
GOWN STRL REIN XL XLG (GOWN DISPOSABLE) ×3 IMPLANT
GOWN STRL REUS W/ TWL LRG LVL3 (GOWN DISPOSABLE) ×1 IMPLANT
GOWN STRL REUS W/ TWL XL LVL3 (GOWN DISPOSABLE) ×1 IMPLANT
GOWN STRL REUS W/TWL LRG LVL3 (GOWN DISPOSABLE) ×2
GOWN STRL REUS W/TWL XL LVL3 (GOWN DISPOSABLE) ×2
MANIFOLD NEPTUNE II (INSTRUMENTS) ×3 IMPLANT
NEEDLE HYPO 22GX1.5 SAFETY (NEEDLE) IMPLANT
NS IRRIG 1000ML POUR BTL (IV SOLUTION) ×3 IMPLANT
PACK BASIN DAY SURGERY FS (CUSTOM PROCEDURE TRAY) ×3 IMPLANT
PAD CAST 3X4 CTTN HI CHSV (CAST SUPPLIES) IMPLANT
PAD CAST 4YDX4 CTTN HI CHSV (CAST SUPPLIES) ×2 IMPLANT
PADDING CAST COTTON 3X4 STRL (CAST SUPPLIES)
PADDING CAST COTTON 4X4 STRL (CAST SUPPLIES) ×4
PADDING CAST COTTON 6X4 STRL (CAST SUPPLIES) ×3 IMPLANT
PADDING CAST SYN 6 (CAST SUPPLIES) ×2
PADDING CAST SYNTHETIC 4 (CAST SUPPLIES) ×2
PADDING CAST SYNTHETIC 4X4 STR (CAST SUPPLIES) ×1 IMPLANT
PADDING CAST SYNTHETIC 6X4 NS (CAST SUPPLIES) ×1 IMPLANT
PENCIL BUTTON HOLSTER BLD 10FT (ELECTRODE) ×3 IMPLANT
PLATE LOCK EVOS 3.5X94 8H (Plate) ×3 IMPLANT
SCREW CANC 4.7X14MM (Screw) ×3 IMPLANT
SCREW CORT 3.5X10MM ST EVOS (Screw) ×3 IMPLANT
SCREW CORT 3.5X15 ST EVOS (Screw) ×3 IMPLANT
SCREW CORT 3.5X8MM ST EVOS (Screw) ×6 IMPLANT
SCREW CORTEX 3.5X18 EVOS (Screw) ×3 IMPLANT
SCREW LOCK 3.5X14MM EVOS (Screw) ×3 IMPLANT
SCREW LOCK EVOS ST 3.5X16 (Screw) ×3 IMPLANT
SHEET MEDIUM DRAPE 40X70 STRL (DRAPES) ×3 IMPLANT
SLEEVE SCD COMPRESS KNEE MED (MISCELLANEOUS) ×3 IMPLANT
SPLINT FIBERGLASS 4X30 (CAST SUPPLIES) ×3 IMPLANT
SPONGE LAP 18X18 RF (DISPOSABLE) ×3 IMPLANT
SUCTION FRAZIER HANDLE 10FR (MISCELLANEOUS) ×2
SUCTION TUBE FRAZIER 10FR DISP (MISCELLANEOUS) ×1 IMPLANT
SUT ETHILON 3 0 PS 1 (SUTURE) ×9 IMPLANT
SUT VIC AB 0 CT1 27 (SUTURE) ×2
SUT VIC AB 0 CT1 27XBRD ANBCTR (SUTURE) ×1 IMPLANT
SUT VIC AB 2-0 CT1 27 (SUTURE) ×4
SUT VIC AB 2-0 CT1 TAPERPNT 27 (SUTURE) ×2 IMPLANT
SUT VIC AB 3-0 SH 27 (SUTURE)
SUT VIC AB 3-0 SH 27X BRD (SUTURE) IMPLANT
SYR BULB 3OZ (MISCELLANEOUS) ×3 IMPLANT
SYR CONTROL 10ML LL (SYRINGE) IMPLANT
TOWEL GREEN STERILE FF (TOWEL DISPOSABLE) ×6 IMPLANT
TOWEL OR NON WOVEN STRL DISP B (DISPOSABLE) IMPLANT
TRAY DSU PREP LF (CUSTOM PROCEDURE TRAY) ×3 IMPLANT
TUBE CONNECTING 20'X1/4 (TUBING) ×1
TUBE CONNECTING 20X1/4 (TUBING) ×2 IMPLANT
UNDERPAD 30X30 (UNDERPADS AND DIAPERS) ×3 IMPLANT
YANKAUER SUCT BULB TIP NO VENT (SUCTIONS) ×3 IMPLANT

## 2018-11-16 NOTE — Transfer of Care (Signed)
Immediate Anesthesia Transfer of Care Note  Patient: Sandra Pineda  Procedure(s) Performed: OPEN REDUCTION INTERNAL FIXATION (ORIF) LEFT LATERAL MALLEOLUS, POSSIBLE SYNDESMOSIS (Left Ankle)  Patient Location: PACU  Anesthesia Type:GA combined with regional for post-op pain  Level of Consciousness: awake, alert , oriented and patient cooperative  Airway & Oxygen Therapy: Patient Spontanous Breathing and Patient connected to face mask oxygen  Post-op Assessment: Report given to RN and Post -op Vital signs reviewed and stable  Post vital signs: Reviewed and stable  Last Vitals:  Vitals Value Taken Time  BP 143/97 11/16/2018 11:20 AM  Temp    Pulse 72 11/16/2018 11:20 AM  Resp    SpO2 96 % 11/16/2018 11:20 AM  Vitals shown include unvalidated device data.  Last Pain:  Vitals:   11/16/18 0950  TempSrc:   PainSc: 0-No pain      Patients Stated Pain Goal: 3 (11/16/18 0950)  Complications: No apparent anesthesia complications

## 2018-11-16 NOTE — Anesthesia Preprocedure Evaluation (Addendum)
Anesthesia Evaluation  Patient identified by MRN, date of birth, ID band Patient awake    Reviewed: Allergy & Precautions, NPO status , Patient's Chart, lab work & pertinent test results  Airway Mallampati: II  TM Distance: >3 FB Neck ROM: Full    Dental  (+) Dental Advisory Given, Poor Dentition, Edentulous Upper, Missing   Pulmonary neg pulmonary ROS, Current Smoker,    Pulmonary exam normal breath sounds clear to auscultation       Cardiovascular negative cardio ROS Normal cardiovascular exam Rhythm:Regular Rate:Normal     Neuro/Psych negative neurological ROS  negative psych ROS   GI/Hepatic negative GI ROS, Neg liver ROS,   Endo/Other  negative endocrine ROS  Renal/GU negative Renal ROS     Musculoskeletal negative musculoskeletal ROS (+)   Abdominal   Peds  Hematology negative hematology ROS (+)   Anesthesia Other Findings   Reproductive/Obstetrics negative OB ROS                            Anesthesia Physical Anesthesia Plan  ASA: II  Anesthesia Plan: General   Post-op Pain Management: GA combined w/ Regional for post-op pain   Induction: Intravenous  PONV Risk Score and Plan: 2 and Ondansetron, Dexamethasone, Midazolam and Treatment may vary due to age or medical condition  Airway Management Planned: LMA  Additional Equipment: None  Intra-op Plan:   Post-operative Plan: Extubation in OR  Informed Consent: I have reviewed the patients History and Physical, chart, labs and discussed the procedure including the risks, benefits and alternatives for the proposed anesthesia with the patient or authorized representative who has indicated his/her understanding and acceptance.   Dental advisory given  Plan Discussed with: CRNA  Anesthesia Plan Comments:        Anesthesia Quick Evaluation

## 2018-11-16 NOTE — Anesthesia Procedure Notes (Signed)
Procedure Name: LMA Insertion Date/Time: 11/16/2018 10:08 AM Performed by: Sheryn BisonBlocker, Griselle Rufer D, CRNA Pre-anesthesia Checklist: Patient identified, Emergency Drugs available, Suction available and Patient being monitored Patient Re-evaluated:Patient Re-evaluated prior to induction Oxygen Delivery Method: Circle system utilized Preoxygenation: Pre-oxygenation with 100% oxygen Induction Type: IV induction Ventilation: Mask ventilation without difficulty LMA: LMA inserted LMA Size: 4.0 Number of attempts: 1 Airway Equipment and Method: Bite block Placement Confirmation: positive ETCO2 Tube secured with: Tape Dental Injury: Teeth and Oropharynx as per pre-operative assessment

## 2018-11-16 NOTE — Discharge Instructions (Signed)
    1. Keep splint clean and dry 2. Elevate foot above level of the heart 3. Take aspirin to prevent blood clots 4. Take pain meds as needed 5. Strict non weight bearing to operative extremity   Post Anesthesia Home Care Instructions  Activity: Get plenty of rest for the remainder of the day. A responsible individual must stay with you for 24 hours following the procedure.  For the next 24 hours, DO NOT: -Drive a car -Operate machinery -Drink alcoholic beverages -Take any medication unless instructed by your physician -Make any legal decisions or sign important papers.  Meals: Start with liquid foods such as gelatin or soup. Progress to regular foods as tolerated. Avoid greasy, spicy, heavy foods. If nausea and/or vomiting occur, drink only clear liquids until the nausea and/or vomiting subsides. Call your physician if vomiting continues.  Special Instructions/Symptoms: Your throat may feel dry or sore from the anesthesia or the breathing tube placed in your throat during surgery. If this causes discomfort, gargle with warm salt water. The discomfort should disappear within 24 hours.  If you had a scopolamine patch placed behind your ear for the management of post- operative nausea and/or vomiting:  1. The medication in the patch is effective for 72 hours, after which it should be removed.  Wrap patch in a tissue and discard in the trash. Wash hands thoroughly with soap and water. 2. You may remove the patch earlier than 72 hours if you experience unpleasant side effects which may include dry mouth, dizziness or visual disturbances. 3. Avoid touching the patch. Wash your hands with soap and water after contact with the patch.  Regional Anesthesia Blocks  1. Numbness or the inability to move the "blocked" extremity may last from 3-48 hours after placement. The length of time depends on the medication injected and your individual response to the medication. If the numbness is not  going away after 48 hours, call your surgeon.  2. The extremity that is blocked will need to be protected until the numbness is gone and the  Strength has returned. Because you cannot feel it, you will need to take extra care to avoid injury. Because it may be weak, you may have difficulty moving it or using it. You may not know what position it is in without looking at it while the block is in effect.  3. For blocks in the legs and feet, returning to weight bearing and walking needs to be done carefully. You will need to wait until the numbness is entirely gone and the strength has returned. You should be able to move your leg and foot normally before you try and bear weight or walk. You will need someone to be with you when you first try to ensure you do not fall and possibly risk injury.  4. Bruising and tenderness at the needle site are common side effects and will resolve in a few days.  5. Persistent numbness or new problems with movement should be communicated to the surgeon or the [facility] ([phone])/ [facility] ([phone]).      

## 2018-11-16 NOTE — Op Note (Signed)
Date of Surgery: 11/16/2018  INDICATIONS: Sandra Pineda is a 42 y.o.-year-old female who sustained a left ankle fracture; she was indicated for open reduction and internal fixation due to the displaced nature of the articular fracture and came to the operating room today for this procedure. The patient did consent to the procedure after discussion of the risks and benefits.  PREOPERATIVE DIAGNOSIS: left weber C fibula fracture  POSTOPERATIVE DIAGNOSIS: Same.  PROCEDURE: Open treatment of left ankle fracture with internal fixation. Lateral malleolar CPT [phone]  SURGEON: N. Glee Arvin, M.D.  ASSIST: Starlyn Skeans [place], [place]; necessary for the timely completion of procedure and due to complexity of procedure.  ANESTHESIA:  general, regional  TOURNIQUET TIME: less than 60 mins  IV FLUIDS AND URINE: See anesthesia.  ESTIMATED BLOOD LOSS: minimal mL.  IMPLANTS: [employer]  COMPLICATIONS: None.  DESCRIPTION OF PROCEDURE: The patient was brought to the operating room and placed supine on the operating table.  The patient had been signed prior to the procedure and this was documented. The patient had the anesthesia placed by the anesthesiologist.  A nonsterile tourniquet was placed on the upper thigh.  The prep verification and incision time-outs were performed to confirm that this was the correct patient, site, side and location. The patient had an SCD on the opposite lower extremity. The patient did receive antibiotics prior to the incision and was re-dosed during the procedure as needed at indicated intervals.  The patient had the lower extremity prepped and draped in the standard surgical fashion.  The extremity was exsanguinated using an esmarch bandage and the tourniquet was inflated to 300 mm Hg.  An incision was made on the lateral aspect of the ankle.  Dissection was carried through the subcutaneous tissue.  This the superficial peroneal nerve was identified and mobilized.   Full-thickness flaps were then elevated off of the lateral aspect of the fibula.  Subperiosteal elevation was performed.  The fracture line was then visualized and booked open.  I then removed organized hematoma.  The fracture was then reduced with traction and rotation which was then clamped with a lobster claw.  I then placed a posterior to anterior lag screw by technique with excellent purchase and compression across the fracture.  The clamp was then removed and then placed a semitubular plate on the lateral aspect of the fibula in a neutralization fashion at the appropriate height using fluoroscopic guidance.  I then placed a nonlocking screw distal to the fracture in order to contour the plate down to the fibula.  This also had excellent purchase.  I then placed additional locking screws proximal to the fracture through the plate each with excellent fixation.  I then placed a 4.0 mm cancellus screw distal to the fracture using fluoroscopic guidance.  Finally I placed a locking unicortical screw through the most distal portion of the plate using fluoroscopic guidance.  The initial screw that I placed was then removed and exchanged for shorter locking screw.  Final x-rays were then taken.  External rotation stress exam showed no widening of the medial clear space.  The wound was then thoroughly irrigated and closed in a layered fashion using 0 Vicryl for the fascia, 2-0 Vicryl for the subcuticular layer and 3-0 nylon for the skin.  Sterile dressings were applied.  She was then placed in a short leg splint and 90 degrees.  Patient tolerated procedure well had no immediate complications.  POSTOPERATIVE PLAN: Ms. Hasten will remain nonweightbearing on this  leg for approximately 6 weeks; Ms. Jean RosenthalJackson will return for suture removal in 2 weeks.  He will be immobilized in a short leg splint and then transitioned to a CAM walker at his first follow up appointment.  Ms. Jean RosenthalJackson will receive DVT prophylaxis based on  other medications, activity level, and risk ratio of bleeding to thrombosis.  Mayra ReelN. Michael Xu, MD [facility] [phone] 10:59 AM

## 2018-11-16 NOTE — Progress Notes (Signed)
Assisted Dr. Germeroth with left, ultrasound guided, popliteal, adductor canal block. Side rails up, monitors on throughout procedure. See vital signs in flow sheet. Tolerated Procedure well. 

## 2018-11-16 NOTE — H&P (Signed)
PREOPERATIVE H&P  Chief Complaint: left lateral malleolus ankle fracture  HPI: Sandra Pineda is a 42 y.o. female who presents for surgical treatment of left lateral malleolus ankle fracture.  She denies any changes in medical history.  Past Medical History:  Diagnosis Date  . Closed fracture of left lateral malleolus   . Smoker    Past Surgical History:  Procedure Laterality Date  . BREAST SURGERY     Social History   Socioeconomic History  . Marital status: Single    Spouse name: Not on file  . Number of children: Not on file  . Years of education: Not on file  . Highest education level: Not on file  Occupational History  . Not on file  Social Needs  . Financial resource strain: Not on file  . Food insecurity:    Worry: Not on file    Inability: Not on file  . Transportation needs:    Medical: Not on file    Non-medical: Not on file  Tobacco Use  . Smoking status: Current Every Day Smoker    Packs/day: 1.00  . Smokeless tobacco: Never Used  Substance and Sexual Activity  . Alcohol use: No  . Drug use: Never  . Sexual activity: Not Currently    Comment: has been on megace x4 yrs and stopped her periods  Lifestyle  . Physical activity:    Days per week: Not on file    Minutes per session: Not on file  . Stress: Not on file  Relationships  . Social connections:    Talks on phone: Not on file    Gets together: Not on file    Attends religious service: Not on file    Active member of club or organization: Not on file    Attends meetings of clubs or organizations: Not on file    Relationship status: Not on file  Other Topics Concern  . Not on file  Social History Narrative  . Not on file   History reviewed. No pertinent family history. No Known Allergies Prior to Admission medications   Medication Sig Start Date End Date Taking? Authorizing Provider  megestrol (MEGACE) 40 MG/ML suspension Take 400 mg by mouth daily.   Yes [provider]    oxyCODONE-acetaminophen (PERCOCET/ROXICET) 5-325 MG tablet Take 1-2 tablets by mouth every 6 (six) hours as needed for severe pain. 11/10/18  Yes Petrucelli, Samantha R, PA-C  methocarbamol (ROBAXIN) 500 MG tablet Take 1 tablet (500 mg total) by mouth every 6 (six) hours as needed for muscle spasms. 11/15/18   Tarry Kos, MD     Positive ROS: All other systems have been reviewed and were otherwise negative with the exception of those mentioned in the HPI and as above.  Physical Exam: General: Alert, no acute distress Cardiovascular: No pedal edema Respiratory: No cyanosis, no use of accessory musculature GI: abdomen soft Skin: No lesions in the area of chief complaint Neurologic: Sensation intact distally Psychiatric: Patient is competent for consent with normal mood and affect Lymphatic: no lymphedema  MUSCULOSKELETAL: exam stable  Assessment: left lateral malleolus ankle fracture  Plan: Plan for Procedure(s): OPEN REDUCTION INTERNAL FIXATION (ORIF) LEFT LATERAL MALLEOLUS, POSSIBLE SYNDESMOSIS  The risks benefits and alternatives were discussed with the patient including but not limited to the risks of nonoperative treatment, versus surgical intervention including infection, bleeding, nerve injury,  blood clots, cardiopulmonary complications, morbidity, mortality, among others, and they were willing to proceed.   Glee Arvin, MD  11/16/2018 8:25 AM

## 2018-11-16 NOTE — Anesthesia Procedure Notes (Signed)
Anesthesia Regional Block: Adductor canal block   Pre-Anesthetic Checklist: ,, timeout performed, Correct Patient, Correct Site, Correct Laterality, Correct Procedure, Correct Position, site marked, Risks and benefits discussed,  Surgical consent,  Pre-op evaluation,  At surgeon's request and post-op pain management  Laterality: Left  Prep: chloraprep       Needles:  Injection technique: Single-shot  Needle Type: Stimiplex     Needle Length: 9cm  Needle Gauge: 21     Additional Needles:   Procedures:,,,, ultrasound used (permanent image in chart),,,,  Narrative:  Start time: 11/16/2018 9:42 AM End time: 11/16/2018 9:45 AM Injection made incrementally with aspirations every 5 mL.  Performed by: Personally  Anesthesiologist: Lewie LoronGermeroth, Brenae Lasecki, MD  Additional Notes: BP cuff, EKG monitors applied. Sedation begun. Patient very difficult to block due to movement and brisk response to needle movement. Artery and nerve location verified with U/S and anesthetic injected incrementally, slowly, and after negative aspirations under direct u/s guidance. Good fascial /perineural spread. Tolerated well.

## 2018-11-16 NOTE — Anesthesia Procedure Notes (Signed)
Anesthesia Regional Block: Popliteal block   Pre-Anesthetic Checklist: ,, timeout performed, Correct Patient, Correct Site, Correct Laterality, Correct Procedure, Correct Position, site marked, Risks and benefits discussed,  Surgical consent,  Pre-op evaluation,  At surgeon's request and post-op pain management  Laterality: Left  Prep: chloraprep       Needles:  Injection technique: Single-shot  Needle Type: Stimiplex     Needle Length: 10cm  Needle Gauge: 21     Additional Needles:   Procedures:,,,, ultrasound used (permanent image in chart),,,,  Motor weakness within 5 minutes.  Narrative:  Start time: 11/16/2018 9:46 AM End time: 11/16/2018 9:50 AM Injection made incrementally with aspirations every 5 mL.  Performed by: Personally  Anesthesiologist: Lewie LoronGermeroth, Nam Vossler, MD  Additional Notes: Nerve located and needle positioned with direct ultrasound guidance. Good perineural spread. Patient tolerated well, although patient very difficult to block due to movement and brisk response to needle movement.

## 2018-11-17 ENCOUNTER — Encounter (HOSPITAL_BASED_OUTPATIENT_CLINIC_OR_DEPARTMENT_OTHER): Payer: Self-pay | Admitting: Orthopaedic Surgery

## 2018-11-17 ENCOUNTER — Telehealth (INDEPENDENT_AMBULATORY_CARE_PROVIDER_SITE_OTHER): Payer: Self-pay | Admitting: Orthopaedic Surgery

## 2018-11-17 NOTE — Telephone Encounter (Signed)
See message.

## 2018-11-17 NOTE — Telephone Encounter (Signed)
yes

## 2018-11-17 NOTE — Telephone Encounter (Signed)
Patient called wanted to see if she can have a walker due to her ankle surgery yesterday

## 2018-11-18 NOTE — Telephone Encounter (Signed)
Ready for pick up here in our office.

## 2018-11-18 NOTE — Anesthesia Postprocedure Evaluation (Signed)
Anesthesia Post Note  Patient: Sandra Pineda  Procedure(s) Performed: OPEN REDUCTION INTERNAL FIXATION (ORIF) LEFT LATERAL MALLEOLUS, POSSIBLE SYNDESMOSIS (Left Ankle)     Patient location during evaluation: PACU Anesthesia Type: General Level of consciousness: sedated and patient cooperative Pain management: pain level controlled Vital Signs Assessment: post-procedure vital signs reviewed and stable Respiratory status: spontaneous breathing Cardiovascular status: stable Anesthetic complications: no    Last Vitals:  Vitals:   11/16/18 1200 11/16/18 1215  BP: (!) 146/90 (!) 146/86  Pulse: 65 66  Resp: 20 18  Temp:  36.7 C  SpO2: 98% 96%    Last Pain:  Vitals:   11/17/18 1512  TempSrc:   PainSc: 6                  Lewie LoronJohn Kaslyn [place]

## 2018-11-25 ENCOUNTER — Telehealth (INDEPENDENT_AMBULATORY_CARE_PROVIDER_SITE_OTHER): Payer: Self-pay | Admitting: Orthopaedic Surgery

## 2018-11-25 MED ORDER — OXYCODONE-ACETAMINOPHEN 5-325 MG PO TABS: 1.0000 | ORAL_TABLET | Freq: Two times a day (BID) | ORAL | 0 refills | Status: DC | PRN

## 2018-11-25 NOTE — Telephone Encounter (Signed)
Called patient to let her know rx sent to pharm.

## 2018-11-25 NOTE — Telephone Encounter (Signed)
See message.

## 2018-11-25 NOTE — Telephone Encounter (Signed)
Patient called requests rx for oxycodone. pts callback [phone]

## 2018-11-25 NOTE — Telephone Encounter (Signed)
done

## 2018-11-29 ENCOUNTER — Ambulatory Visit (INDEPENDENT_AMBULATORY_CARE_PROVIDER_SITE_OTHER): Payer: Medicaid Other

## 2018-11-29 ENCOUNTER — Ambulatory Visit (INDEPENDENT_AMBULATORY_CARE_PROVIDER_SITE_OTHER): Payer: Medicaid Other | Admitting: Physician Assistant

## 2018-11-29 ENCOUNTER — Encounter (INDEPENDENT_AMBULATORY_CARE_PROVIDER_SITE_OTHER): Payer: Self-pay | Admitting: Orthopaedic Surgery

## 2018-11-29 DIAGNOSIS — S8262XA Displaced fracture of lateral malleolus of left fibula, initial encounter for closed fracture: Secondary | ICD-10-CM | POA: Diagnosis not present

## 2018-11-29 NOTE — Progress Notes (Signed)
   Post-Op Visit Note   Patient: Sandra Pineda           Date of Birth: 1976/07/21           MRN: [id number] Visit Date: 11/29/2018 PCP: [facility], [facility]   Assessment & Plan:  Chief Complaint: No chief complaint on file.  Visit Diagnoses:  1. Closed displaced fracture of lateral malleolus of left fibula, initial encounter     Plan: Patient is a 42 year old female who presents to our clinic today 13 days status post ORIF left lateral malleolus fracture, date of surgery 11/16/2018.  She has been compliant nonweightbearing.  No fevers or chills.  She does note moderate pain despite taking narcotic pain medication and muscle relaxers every 6 hours.  She does note that she has been elevating her left ankle.  Examination of left ankle reveals moderate swelling.  Calf is soft nontender.  Well-healing surgical incision with nylon sutures in place.  No evidence of infection.  She is neurovascularly intact distally.  At this point, we will remove the stitches and apply Steri-Strips.  We will place the patient in a cam walker nonweightbearing for another 4 weeks.  Follow-up with us in 4 weeks time for repeat evaluation and 3 view x-rays of the left ankle.  We anticipate starting her in physical therapy at that point.  Follow-Up Instructions: Return in about 4 weeks (around 12/27/2018).   Orders:  Orders Placed This Encounter  Procedures  . XR Ankle Complete Left   No orders of the defined types were placed in this encounter.   Imaging: Xr Ankle Complete Left  Result Date: 11/29/2018 X-rays demonstrate stable alignment of the fracture   PMFS History: Patient Active Problem List   Diagnosis Date Noted  . Closed fracture of left lateral malleolus 11/15/2018   Past Medical History:  Diagnosis Date  . Closed fracture of left lateral malleolus   . Smoker     History reviewed. No pertinent family history.  Past Surgical History:  Procedure Laterality Date  . BREAST SURGERY    .  ORIF ANKLE FRACTURE Left 11/16/2018   Procedure: OPEN REDUCTION INTERNAL FIXATION (ORIF) LEFT LATERAL MALLEOLUS, POSSIBLE SYNDESMOSIS;  Surgeon: Tarry KosXu, Naiping M, MD;  Location: [facility];  Service: Orthopedics;  Laterality: Left;   Social History   Occupational History  . Not on file  Tobacco Use  . Smoking status: Current Every Day Smoker    Packs/day: 1.00  . Smokeless tobacco: Never Used  Substance and Sexual Activity  . Alcohol use: No  . Drug use: Never  . Sexual activity: Not Currently    Comment: has been on megace x4 yrs and stopped her periods

## 2018-12-27 ENCOUNTER — Encounter (INDEPENDENT_AMBULATORY_CARE_PROVIDER_SITE_OTHER): Payer: Self-pay | Admitting: Orthopaedic Surgery

## 2018-12-27 ENCOUNTER — Ambulatory Visit (INDEPENDENT_AMBULATORY_CARE_PROVIDER_SITE_OTHER): Payer: Medicaid Other | Admitting: Physician Assistant

## 2018-12-27 ENCOUNTER — Ambulatory Visit (INDEPENDENT_AMBULATORY_CARE_PROVIDER_SITE_OTHER): Payer: Medicaid Other

## 2018-12-27 DIAGNOSIS — S8262XA Displaced fracture of lateral malleolus of left fibula, initial encounter for closed fracture: Secondary | ICD-10-CM | POA: Diagnosis not present

## 2018-12-27 MED ORDER — TRAMADOL HCL 50 MG PO TABS: ORAL_TABLET | ORAL | 0 refills | Status: DC

## 2018-12-27 NOTE — Progress Notes (Signed)
   Post-Op Visit Note   Patient: Sandra Pineda           Date of Birth: Dec 28, 1975           MRN: [id number] Visit Date: 12/27/2018 [facility]: [facility]   Assessment & Plan:  Chief Complaint:  Chief Complaint  Patient presents with  . Left Ankle - Pain, Routine Post Op, Follow-up   Visit Diagnoses:  1. Closed displaced fracture of lateral malleolus of left fibula, initial encounter     Plan: Patient is a 43 year old female who presents to our clinic today 41 days status post ORIF left lateral malleolus fracture, date of injury 11/16/2018.  She not been totally compliant with nonweightbearing in her cam walker.  She has been weightbearing at times.  She has been taking an occasional Norco for pain.  She has not been elevating or using ice.  Examination of her left ankle reveals well-healed surgical incision without evidence of infection.  Calf is soft nontender.  Limited range of motion.  Moderate swelling.  She is neurovascular intact distally.  At this point, we will allow her to be full weightbearing in her cam walker.  We will provide her with an ASO brace which she will transition to during the course of physical therapy.  We will start her in outpatient physical therapy and a prescription was provided today to the patient.  She will elevate for pain and swelling.  She will follow-up with [place] in 6 weeks time for repeat evaluation three-view x-rays of the left ankle.  Follow-Up Instructions: Return in about 6 weeks (around 02/07/2019).   Orders:  Orders Placed This Encounter  Procedures  . XR Ankle Complete Left   No orders of the defined types were placed in this encounter.   Imaging: Xr Ankle Complete Left  Result Date: 12/27/2018 X-rays demonstrate stable alignment of the fracture with callus formation   PMFS History: Patient Active Problem List   Diagnosis Date Noted  . Closed fracture of left lateral malleolus 11/15/2018   Past Medical History:  Diagnosis Date    . Closed fracture of left lateral malleolus   . Smoker     History reviewed. No pertinent family history.  Past Surgical History:  Procedure Laterality Date  . BREAST SURGERY    . ORIF ANKLE FRACTURE Left 11/16/2018   Procedure: OPEN REDUCTION INTERNAL FIXATION (ORIF) LEFT LATERAL MALLEOLUS, POSSIBLE SYNDESMOSIS;  Surgeon: Tarry Kos, MD;  Location: [facility];  Service: Orthopedics;  Laterality: Left;   Social History   Occupational History  . Not on file  Tobacco Use  . Smoking status: Current Every Day Smoker    Packs/day: 1.00  . Smokeless tobacco: Never Used  Substance and Sexual Activity  . Alcohol use: No  . Drug use: Never  . Sexual activity: Not Currently    Comment: has been on megace x4 yrs and stopped her periods

## 2018-12-27 NOTE — Addendum Note (Signed)
Addended by: Albertina ParrGARCIA, Darlyn Repsher on: 12/27/2018 04:59 PM   Modules accepted: Orders

## 2019-01-05 ENCOUNTER — Ambulatory Visit: Payer: Medicaid Other | Admitting: Physical Therapy

## 2019-01-11 ENCOUNTER — Ambulatory Visit (INDEPENDENT_AMBULATORY_CARE_PROVIDER_SITE_OTHER): Payer: Medicaid Other | Admitting: Orthopaedic Surgery

## 2019-01-11 ENCOUNTER — Ambulatory Visit (INDEPENDENT_AMBULATORY_CARE_PROVIDER_SITE_OTHER): Payer: Self-pay

## 2019-01-11 ENCOUNTER — Encounter (INDEPENDENT_AMBULATORY_CARE_PROVIDER_SITE_OTHER): Payer: Self-pay | Admitting: Orthopaedic Surgery

## 2019-01-11 DIAGNOSIS — S8262XA Displaced fracture of lateral malleolus of left fibula, initial encounter for closed fracture: Secondary | ICD-10-CM

## 2019-01-11 NOTE — Progress Notes (Signed)
Paperwork filled out today for food stamps.  Follow-up as scheduled.

## 2019-01-19 ENCOUNTER — Ambulatory Visit: Payer: Medicaid Other | Admitting: Obstetrics and Gynecology

## 2019-01-26 ENCOUNTER — Encounter: Payer: Self-pay | Admitting: Physical Therapy

## 2019-01-26 ENCOUNTER — Ambulatory Visit: Payer: Medicaid Other | Attending: Orthopaedic Surgery | Admitting: Physical Therapy

## 2019-01-26 ENCOUNTER — Other Ambulatory Visit: Payer: Self-pay

## 2019-01-26 DIAGNOSIS — R600 Localized edema: Secondary | ICD-10-CM | POA: Diagnosis present

## 2019-01-26 DIAGNOSIS — M25672 Stiffness of left ankle, not elsewhere classified: Secondary | ICD-10-CM | POA: Diagnosis present

## 2019-01-26 DIAGNOSIS — R262 Difficulty in walking, not elsewhere classified: Secondary | ICD-10-CM | POA: Diagnosis present

## 2019-01-26 DIAGNOSIS — M25572 Pain in left ankle and joints of left foot: Secondary | ICD-10-CM

## 2019-01-26 DIAGNOSIS — M6281 Muscle weakness (generalized): Secondary | ICD-10-CM | POA: Diagnosis present

## 2019-01-26 NOTE — Therapy (Signed)
[facility] Out[facility] [address] [place], [place], [address] Phone: [phone]   Fax:  [phone]  Physical Therapy Evaluation  Patient Details  Name: Sandra Pineda MRN: [id number] Date of Birth: 04/17/1976 Referring Provider (PT): Gershon Mussel, MD   Encounter Date: 01/26/2019  PT End of Session - 01/26/19 2024    Visit Number  1    Number of Visits  4    Date for PT Re-Evaluation  02/16/19    Authorization Type  Medicaid-requesting 3 initial visits    PT Start Time  1505    PT Stop Time  1541    PT Time Calculation (min)  36 min    Activity Tolerance  Patient tolerated treatment well    Behavior During Therapy  [facility] for tasks assessed/performed       Past Medical History:  Diagnosis Date  . Closed fracture of left lateral malleolus   . Smoker     Past Surgical History:  Procedure Laterality Date  . BREAST SURGERY    . ORIF ANKLE FRACTURE Left 11/16/2018   Procedure: OPEN REDUCTION INTERNAL FIXATION (ORIF) LEFT LATERAL MALLEOLUS, POSSIBLE SYNDESMOSIS;  Surgeon: Tarry Kos, MD;  Location: [facility];  Service: Orthopedics;  Laterality: Left;    There were no vitals filed for this visit.   Subjective Assessment - 01/26/19 1508    Subjective  Pt. sustained left lateral malleolus fracture 11/09/18 due to fall associated with "missing step" on stairs at home. Pt. underwent ORIF surgery 11/16/18 to address. Pt. currently WBAT in CAM boot with clearance to progress into ASO with PT (pt. already has brace).     Pertinent History  ORIF left lateral malleolus fracture 11/16/18, tobacco use    Limitations  Standing;Walking    How long can you sit comfortably?  no limitations    How long can you stand comfortably?  does not know-limited static standing at this point    How long can you walk comfortably?  less than 5 minutes    Diagnostic tests  x-rays    Patient Stated Goals  Unsure    Currently in Pain?  Yes     Pain Score  2     Pain Location  Ankle    Pain Orientation  Left;Lateral    Pain Descriptors / Indicators  Aching    Pain Type  Acute pain;Surgical pain    Pain Onset  More than a month ago    Pain Frequency  Intermittent    Aggravating Factors   standing and walking    Pain Relieving Factors  rest    Effect of Pain on Daily Activities  limits tolerance standing and walking         [facility] PT Assessment - 01/26/19 0001      Assessment   Medical Diagnosis  s/p ORIF left lateral malleolus    Referring Provider (PT)  Gershon Mussel, MD    Onset Date/Surgical Date  11/16/18    Next MD Visit  02/07/2019      Precautions   Precautions  None      Restrictions   Weight Bearing Restrictions  --   WBAT LLE, OK transition CAM boot to ASO with PT     Balance Screen   Has the patient fallen in the past 6 months  Yes    How many times?  1   with injury as noted in subjective     Home Environment   Living Environment  Private residence    Living Arrangements  Children    Type of Home  House    Home Access  --   4 steps to front entrance   Home Layout  One level      Prior Function   Level of Independence  Independent with community mobility without device      Cognition   Overall Cognitive Status  Within Functional Limits for tasks assessed      Observation/Other Assessments   Skin Integrity  ORIF incision well healed with no signs infection      Observation/Other Assessments-Edema    Edema  --   left ankle 50 cm, right 49.5 cm with figure 8 measurement     ROM / Strength   AROM / PROM / Strength  AROM;PROM;Strength      AROM   AROM Assessment Site  Ankle    Right/Left Ankle  Right;Left    Right Ankle Dorsiflexion  7    Right Ankle Plantar Flexion  50    Right Ankle Inversion  34    Right Ankle Eversion  20    Left Ankle Dorsiflexion  -2    Left Ankle Plantar Flexion  30    Left Ankle Inversion  16    Left Ankle Eversion  10      PROM   PROM Assessment Site  Ankle     Right/Left Ankle  Left    Left Ankle Dorsiflexion  0    Left Ankle Plantar Flexion  30    Left Ankle Inversion  25    Left Ankle Eversion  16      Strength   Overall Strength Comments  Rt. ankle strength grossly 5/5, left side MMTs not tested given timeframe s/p ORIF      Ambulation/Gait   Gait Comments  Pt. ambulates in clinic mod I with CAM boot with mild antalgia                Objective measurements completed on examination: See above findings.      [facility] Adult PT Treatment/Exercise - 01/26/19 0001      Exercises   Exercises  Ankle      Ankle Exercises: Stretches   Other Stretch  instructed HEP for longsitting gastroc stretch with strap with brief practice      Ankle Exercises: Seated   ABC's  --   instructed HEP     Ankle Exercises: Supine   T-Band  HEP instruction and brief practice 4-way ankle with red band issued for HEP             PT Education - 01/26/19 2023    Education Details  HEP, POC, eval findings    Person(s) Educated  Patient    Methods  Explanation;Demonstration;Tactile cues;Verbal cues;Handout    Comprehension  Verbal cues required;Tactile cues required;Returned demonstration;Verbalized understanding          PT Long Term Goals - 01/26/19 2029      PT LONG TERM GOAL #1   Title  Independent with HEP    Baseline  no HEP    Time  3    Period  Weeks    Status  New    Target Date  02/16/19      PT LONG TERM GOAL #2   Title  Increase left ankle dorsiflexion AROM at least 5 deg from current status to improve toe clearance with gait    Baseline  -2 deg from neutral  Time  3    Period  Weeks    Status  New    Target Date  02/16/19      PT LONG TERM GOAL #3   Title  Transition from use of CAM boot left LE to ASO brace for progression to independent gait without AD    Baseline  uses CAM boot    Time  3    Period  Weeks    Status  New    Target Date  02/16/19             Plan - 01/26/19 2025    Clinical  Impression Statement  Pt. presents with left ankle pain, decreased ROM/stiffness, muscle weakness, and decreased balance/proprioception s/p ORIF surgery for left ankle fracture 11/16/18. Pt. would benefit from PT to help address current deficits to improve functional status with mobility and assist return to PLOF.    History and Personal Factors relevant to plan of care:  tobacco use    Clinical Presentation  Stable    Clinical Decision Making  Low    Rehab Potential  Good    PT Frequency  --   eval + 3 follow up treatments   PT Duration  4 weeks    PT Treatment/Interventions  ADLs/Self Care Home Management;Cryotherapy;Moist Heat;Electrical Stimulation;Gait training;Stair training;Functional mobility training;Neuromuscular re-education;Balance training;Therapeutic exercise;Therapeutic activities;Patient/family education;Manual techniques;Passive range of motion;Taping;Vasopneumatic Device    PT Next Visit Plan  Review HEP as needed, trial ASO brace, continue ankle ROM and stretches and progress standing/CKC activities as balance/proprioceptive exercises as tolerated.    PT Home Exercise Plan  ankle alphabet, longsitting gastroc stretch, Theraband ankle 4-way    Consulted and Agree with Plan of Care  Patient       Patient will benefit from skilled therapeutic intervention in order to improve the following deficits and impairments:  Pain, Abnormal gait, Increased edema, Impaired flexibility, Decreased balance, Decreased activity tolerance, Decreased endurance, Decreased range of motion, Decreased strength, Hypomobility  Visit Diagnosis: Pain in left ankle and joints of left foot  Stiffness of left ankle, not elsewhere classified  Muscle weakness (generalized)  Difficulty in walking, not elsewhere classified  Localized edema     Problem List Patient Active Problem List   Diagnosis Date Noted  . Closed fracture of left lateral malleolus 11/15/2018    Lazarus Gowda, PT,  DPT 01/26/19 8:33 PM  [facility] [address] [place], [place], [address] Phone: [phone]   Fax:  [phone]  Name: Sandra Pineda MRN: [id number] Date of Birth: 1976-03-23

## 2019-02-01 ENCOUNTER — Encounter: Payer: Self-pay | Admitting: Obstetrics and Gynecology

## 2019-02-01 ENCOUNTER — Other Ambulatory Visit (HOSPITAL_COMMUNITY)
Admission: RE | Admit: 2019-02-01 | Discharge: 2019-02-01 | Disposition: A | Discharge status: Home / Self Care | Payer: Medicaid Other | Source: Ambulatory Visit | Attending: Obstetrics and Gynecology | Admitting: Obstetrics and Gynecology

## 2019-02-01 ENCOUNTER — Ambulatory Visit: Payer: Medicaid Other | Admitting: Obstetrics and Gynecology

## 2019-02-01 DIAGNOSIS — Z01419 Encounter for gynecological examination (general) (routine) without abnormal findings: Secondary | ICD-10-CM | POA: Insufficient documentation

## 2019-02-01 DIAGNOSIS — R102 Pelvic and perineal pain: Secondary | ICD-10-CM | POA: Insufficient documentation

## 2019-02-01 DIAGNOSIS — Z0000 Encounter for general adult medical examination without abnormal findings: Secondary | ICD-10-CM

## 2019-02-01 DIAGNOSIS — Z1231 Encounter for screening mammogram for malignant neoplasm of breast: Secondary | ICD-10-CM | POA: Insufficient documentation

## 2019-02-01 NOTE — Progress Notes (Signed)
Sandra Pineda is a 43 y.o. [id number]. female here for a routine annual gynecologic exam. She reports chronic pelvic pain R > L for the last several yrs. Describes pain as sharp/stabby in nature. Motrin helps some. Denies any bowel or bladder dysfunction. Last IC was in Oct without problems. H/O Breast surgery, BTL, c section and left ankle ORIF. Takes Megace daily, prescribed by PCP. No cycle with Megace    Gynecologic History No LMP recorded. (Menstrual status: Other). Contraception: tubal ligation Last Pap: NA. Results were: normal Last mammogram: NA. Results were: NA  Obstetric History OB History  No obstetric history on file.    Past Medical History:  Diagnosis Date  . Closed fracture of left lateral malleolus   . Smoker     Past Surgical History:  Procedure Laterality Date  . BREAST SURGERY    . ORIF ANKLE FRACTURE Left 11/16/2018   Procedure: OPEN REDUCTION INTERNAL FIXATION (ORIF) LEFT LATERAL MALLEOLUS, POSSIBLE SYNDESMOSIS;  Surgeon: Tarry Kos, MD;  Location: [facility];  Service: Orthopedics;  Laterality: Left;    Current Outpatient Medications on File Prior to Visit  Medication Sig Dispense Refill  . ibuprofen (ADVIL,MOTRIN) 400 MG tablet Take 400 mg by mouth every 6 (six) hours as needed for moderate pain.    . megestrol (MEGACE) 40 MG/ML suspension Take 400 mg by mouth daily.    . methocarbamol (ROBAXIN) 500 MG tablet Take 1 tablet (500 mg total) by mouth every 6 (six) hours as needed for muscle spasms. (Patient not taking: Reported on 01/26/2019) 30 tablet 2   No current facility-administered medications on file prior to visit.     No Known Allergies  Social History   Socioeconomic History  . Marital status: Single    Spouse name: Not on file  . Number of children: Not on file  . Years of education: Not on file  . Highest education level: Not on file  Occupational History  . Not on file  Social Needs  . Financial resource strain: Not  on file  . Food insecurity:    Worry: Not on file    Inability: Not on file  . Transportation needs:    Medical: Not on file    Non-medical: Not on file  Tobacco Use  . Smoking status: Current Every Day Smoker    Packs/day: 1.00    Types: Cigarettes  . Smokeless tobacco: Never Used  Substance and Sexual Activity  . Alcohol use: No  . Drug use: Never  . Sexual activity: Not Currently    Birth control/protection: Surgical    Comment: has been on megace x4 yrs and stopped her periods  Lifestyle  . Physical activity:    Days per week: Not on file    Minutes per session: Not on file  . Stress: Not on file  Relationships  . Social connections:    Talks on phone: Not on file    Gets together: Not on file    Attends religious service: Not on file    Active member of club or organization: Not on file    Attends meetings of clubs or organizations: Not on file    Relationship status: Not on file  . Intimate partner violence:    Fear of current or ex partner: Not on file    Emotionally abused: Not on file    Physically abused: Not on file    Forced sexual activity: Not on file  Other Topics Concern  . Not on  file  Social History Narrative  . Not on file    History reviewed. No pertinent family history.  The following portions of the patient's history were reviewed and updated as appropriate: allergies, current medications, past family history, past medical history, past social history, past surgical history and problem list.  Review of Systems Pertinent items noted in HPI and remainder of comprehensive ROS otherwise negative.   Objective:  BP 138/85   Pulse 78   Ht 5\' 5"  (1.651 m)   Wt 123 lb (55.8 kg)   BMI 20.47 kg/m  CONSTITUTIONAL: Well-developed, well-nourished female in no acute distress.  HENT:  Normocephalic, atraumatic, External right and left ear normal. Oropharynx is clear and moist EYES: Conjunctivae and EOM are normal. Pupils are equal, round, and reactive  to light. No scleral icterus.  NECK: Normal range of motion, supple, no masses.  Normal thyroid.  SKIN: Skin is warm and dry. No rash noted. Not diaphoretic. No erythema. No pallor. NEUROLGIC: Alert and oriented to person, place, and time. Normal reflexes, muscle tone coordination. No cranial nerve deficit noted. PSYCHIATRIC: Normal mood and affect. Normal behavior. Normal judgment and thought content. CARDIOVASCULAR: Normal heart rate noted, regular rhythm RESPIRATORY: Clear to auscultation bilaterally. Effort and breath sounds normal, no problems with respiration noted. BREASTS: Symmetric in size. No masses, skin changes, nipple drainage, or lymphadenopathy. ABDOMEN: Soft, normal bowel sounds, no distention noted.  No tenderness, rebound or guarding.  PELVIC: Normal appearing external genitalia; normal appearing vaginal mucosa and cervix.  No abnormal discharge noted.  Pap smear obtained.  Normal uterine size, no other palpable masses, no uterine or adnexal tenderness. MUSCULOSKELETAL: Normal range of motion. No tenderness.  No cyanosis, clubbing, or edema.  2+ distal pulses.   Assessment:  Annual gynecologic examination with pap smear Pelvic pain Screening mammogram Plan:  Will follow up results of pap smear and manage accordingly. Mammogram scheduled. Will check GYN U/S. F/U per test results Routine preventative health maintenance measures emphasized. Please refer to After Visit Summary for other counseling recommendations.    Hermina Staggers, MD, FACOG Attending Obstetrician & Gynecologist Center for [facility], [facility]

## 2019-02-01 NOTE — Patient Instructions (Signed)

## 2019-02-02 LAB — CYTOLOGY - PAP
Diagnosis: NEGATIVE
HPV: NOT DETECTED

## 2019-02-06 ENCOUNTER — Ambulatory Visit: Payer: Medicaid Other | Attending: Orthopaedic Surgery | Admitting: Physical Therapy

## 2019-02-06 ENCOUNTER — Telehealth: Payer: Self-pay | Admitting: Physical Therapy

## 2019-02-06 DIAGNOSIS — M25672 Stiffness of left ankle, not elsewhere classified: Secondary | ICD-10-CM | POA: Insufficient documentation

## 2019-02-06 DIAGNOSIS — R600 Localized edema: Secondary | ICD-10-CM | POA: Insufficient documentation

## 2019-02-06 DIAGNOSIS — R262 Difficulty in walking, not elsewhere classified: Secondary | ICD-10-CM | POA: Insufficient documentation

## 2019-02-06 DIAGNOSIS — M6281 Muscle weakness (generalized): Secondary | ICD-10-CM | POA: Insufficient documentation

## 2019-02-06 DIAGNOSIS — M25572 Pain in left ankle and joints of left foot: Secondary | ICD-10-CM | POA: Insufficient documentation

## 2019-02-06 NOTE — Telephone Encounter (Signed)
Called and left message regarding no show for 10:15 appointment this AM with reminder next appointment time.

## 2019-02-07 ENCOUNTER — Ambulatory Visit (INDEPENDENT_AMBULATORY_CARE_PROVIDER_SITE_OTHER): Payer: Medicaid Other | Admitting: Orthopaedic Surgery

## 2019-02-08 ENCOUNTER — Ambulatory Visit: Payer: Medicaid Other | Admitting: Physical Therapy

## 2019-02-09 ENCOUNTER — Ambulatory Visit (HOSPITAL_COMMUNITY)
Admission: RE | Admit: 2019-02-09 | Discharge: 2019-02-09 | Disposition: A | Discharge status: Home / Self Care | Payer: Medicaid Other | Source: Ambulatory Visit | Attending: Obstetrics and Gynecology | Admitting: Obstetrics and Gynecology

## 2019-02-09 ENCOUNTER — Other Ambulatory Visit: Payer: Self-pay

## 2019-02-09 DIAGNOSIS — R102 Pelvic and perineal pain: Secondary | ICD-10-CM | POA: Insufficient documentation

## 2019-02-14 ENCOUNTER — Encounter: Payer: Self-pay | Admitting: Physical Therapy

## 2019-02-14 ENCOUNTER — Other Ambulatory Visit: Payer: Self-pay

## 2019-02-14 ENCOUNTER — Ambulatory Visit: Payer: Medicaid Other | Admitting: Physical Therapy

## 2019-02-14 DIAGNOSIS — M25572 Pain in left ankle and joints of left foot: Secondary | ICD-10-CM

## 2019-02-14 DIAGNOSIS — R262 Difficulty in walking, not elsewhere classified: Secondary | ICD-10-CM | POA: Diagnosis present

## 2019-02-14 DIAGNOSIS — M25672 Stiffness of left ankle, not elsewhere classified: Secondary | ICD-10-CM | POA: Diagnosis present

## 2019-02-14 DIAGNOSIS — R600 Localized edema: Secondary | ICD-10-CM | POA: Diagnosis present

## 2019-02-14 DIAGNOSIS — M6281 Muscle weakness (generalized): Secondary | ICD-10-CM

## 2019-02-14 NOTE — Therapy (Signed)
[facility] [address] [place], [place], [address] Phone: [phone]   Fax:  [phone]  Physical Therapy Treatment  Patient Details  Name: Sandra Pineda MRN: [id number] Date of Birth: May 21, 1976 Referring Provider (PT): Gershon Mussel, MD   Encounter Date: 02/14/2019  PT End of Session - 02/14/19 1503    Visit Number  2    Number of Visits  4    Date for PT Re-Evaluation  02/16/19    Authorization Type  Medicaid    Authorization Time Period  02/02/19-02/22/19    Authorization - Visit Number  1    Authorization - Number of Visits  3    PT Start Time  1504    PT Stop Time  1543    PT Time Calculation (min)  39 min       Past Medical History:  Diagnosis Date  . Closed fracture of left lateral malleolus   . Smoker     Past Surgical History:  Procedure Laterality Date  . BREAST SURGERY    . ORIF ANKLE FRACTURE Left 11/16/2018   Procedure: OPEN REDUCTION INTERNAL FIXATION (ORIF) LEFT LATERAL MALLEOLUS, POSSIBLE SYNDESMOSIS;  Surgeon: Tarry Kos, MD;  Location: [facility];  Service: Orthopedics;  Laterality: Left;    There were no vitals filed for this visit.  Subjective Assessment - 02/14/19 1509    Subjective  Pt. arrives for 2nd PT session/first visits since eval She had to miss a couple of appointments while dealing with her child's medical issues/seizures. She reports today has been her 2nd time not wearing CAM boot but still has not worn ASO (plan try today in PT). Noting some intermittent sharp pains but no new trauma, no new complaints/concerns otherwise.    Currently in Pain?  Yes    Pain Score  7     Pain Location  Ankle    Pain Orientation  Left;Lateral    Pain Descriptors / Indicators  Aching    Pain Type  Acute pain;Surgical pain    Pain Onset  More than a month ago    Pain Frequency  Intermittent    Aggravating Factors   standing and walking    Pain Relieving Factors  rest    Effect of  Pain on Daily Activities  limits tolerance standing and walking                       [facility] Adult PT Treatment/Exercise - 02/14/19 0001      Exercises   Exercises  Knee/Hip      Knee/Hip Exercises: Standing   Forward Step Up  Both;1 set;10 reps;Hand Hold: 1;Step Height: 4"    Functional Squat Limitations  Partial squat at counter x 15 reps      Ankle Exercises: Aerobic   Nustep  L3 x 5 min UE/LE      Ankle Exercises: Standing   Rocker Board  2 minutes   1 min ea. fw/rev and lateral weightshifts dynamic balance   Other Standing Ankle Exercises  Romberg Airex EC/EO 3x20 sec, Airex marches x 20    Other Standing Ankle Exercises  Pall off press on Airex with green band feet together x 15 ea. way      Ankle Exercises: Seated   BAPS  Sitting;Level 1;15 reps    Other Seated Ankle Exercises  seated tilt board PF/DF, IV/EV x 20 ea.      Ankle Exercises: Supine   T-Band  Theraband 4-way red band 2x10      Ankle Exercises: Stretches   Gastroc Stretch  3 reps;30 seconds   left side supine manual stretch            PT Education - 02/14/19 1512    Education Details  ASO brace use included graded progression of wear over the next several days, exercises    Person(s) Educated  Patient    Methods  Explanation;Demonstration    Comprehension  Verbalized understanding;Returned demonstration          PT Long Term Goals - 01/26/19 2029      PT LONG TERM GOAL #1   Title  Independent with HEP    Baseline  no HEP    Time  3    Period  Weeks    Status  New    Target Date  02/16/19      PT LONG TERM GOAL #2   Title  Increase left ankle dorsiflexion AROM at least 5 deg from current status to improve toe clearance with gait    Baseline  -2 deg from neutral    Time  3    Period  Weeks    Status  New    Target Date  02/16/19      PT LONG TERM GOAL #3   Title  Transition from use of CAM boot left LE to ASO brace for progression to independent gait without AD     Baseline  uses CAM boot    Time  3    Period  Weeks    Status  New    Target Date  02/16/19            Plan - 02/14/19 1512    Clinical Impression Statement  Trial ASO brace today-mild difficulty fitting with footwear but overall use well-tolerated and recommend continued use. Need for PT to address left ankle weakness and proprioceptive deficits will be ongoing.    Clinical Decision Making  Low    Rehab Potential  Good    PT Frequency  --   eval + 3 follow up visits   PT Duration  4 weeks    PT Treatment/Interventions  ADLs/Self Care Home Management;Cryotherapy;Moist Heat;Electrical Stimulation;Gait training;Stair training;Functional mobility training;Neuromuscular re-education;Balance training;Therapeutic exercise;Therapeutic activities;Patient/family education;Manual techniques;Passive range of motion;Taping;Vasopneumatic Device    PT Next Visit Plan  Continue ASO brace use, continue ankle ROM and stretches and progress standing/CKC activities as balance/proprioceptive exercises as tolerated.    PT Home Exercise Plan  ankle alphabet, longsitting gastroc stretch, Theraband ankle 4-way       Patient will benefit from skilled therapeutic intervention in order to improve the following deficits and impairments:  Pain, Abnormal gait, Increased edema, Impaired flexibility, Decreased balance, Decreased activity tolerance, Decreased endurance, Decreased range of motion, Decreased strength, Hypomobility  Visit Diagnosis: Pain in left ankle and joints of left foot  Stiffness of left ankle, not elsewhere classified  Muscle weakness (generalized)  Difficulty in walking, not elsewhere classified  Localized edema     Problem List Patient Active Problem List   Diagnosis Date Noted  . Gynecologic exam normal 02/01/2019  . Screening mammogram, encounter for 02/01/2019  . Pelvic pain 02/01/2019  . Closed fracture of left lateral malleolus 11/15/2018    Lazarus Gowda, PT,  DPT 02/14/19 3:45 PM  [facility] [address] [place], [place], [address] Phone: [phone]   Fax:  [phone]  Name: Sandra Pineda MRN: [id number] Date of Birth:  10/20/1976   

## 2019-02-22 ENCOUNTER — Ambulatory Visit: Payer: Medicaid Other | Admitting: Physical Therapy

## 2019-02-28 ENCOUNTER — Ambulatory Visit: Payer: Medicaid Other

## 2019-03-07 ENCOUNTER — Telehealth: Payer: Self-pay | Admitting: Physical Therapy

## 2019-03-07 NOTE — Telephone Encounter (Signed)
Sandra Pineda was contacted today regarding the temporary reduction of OP Rehab Services due to concerns for community transmission of Covid-19.    Therapist advised the patient to continue to perform their HEP and assured they had no unanswered questions at this time.  The patient expressed interest in being contacted for an e-visit, virtual check in, or telehealth visit to continue their POC care, when those services become available.     Outpatient Rehabilitation Services will follow up with patients at that time.

## 2019-03-27 ENCOUNTER — Ambulatory Visit: Payer: Medicaid Other

## 2019-04-05 ENCOUNTER — Telehealth: Payer: Self-pay | Admitting: Physical Therapy

## 2019-04-05 NOTE — Telephone Encounter (Signed)
Contacted patient to offer Telehealth or in-clinic physical therapy services. Patient reports she does not have transportation and would  Like to use Telehealth. Her information was forwarded to operations staff for set-up and scheduling.

## 2019-04-11 ENCOUNTER — Ambulatory Visit: Payer: Medicaid Other | Admitting: Physical Therapy

## 2019-04-17 ENCOUNTER — Ambulatory Visit: Payer: Medicaid Other | Attending: Orthopaedic Surgery | Admitting: Physical Therapy

## 2019-04-17 ENCOUNTER — Encounter: Payer: Self-pay | Admitting: Physical Therapy

## 2019-04-17 DIAGNOSIS — R600 Localized edema: Secondary | ICD-10-CM | POA: Insufficient documentation

## 2019-04-17 DIAGNOSIS — M25672 Stiffness of left ankle, not elsewhere classified: Secondary | ICD-10-CM | POA: Diagnosis present

## 2019-04-17 DIAGNOSIS — R262 Difficulty in walking, not elsewhere classified: Secondary | ICD-10-CM | POA: Diagnosis present

## 2019-04-17 DIAGNOSIS — M25572 Pain in left ankle and joints of left foot: Secondary | ICD-10-CM | POA: Insufficient documentation

## 2019-04-17 DIAGNOSIS — M6281 Muscle weakness (generalized): Secondary | ICD-10-CM | POA: Diagnosis present

## 2019-04-17 NOTE — Patient Instructions (Signed)
Access Code: [email or web address]  URL: [email or web address]  Date: 04/17/2019  Prepared by: Karie Mainland   Exercises  Single Leg Balance - 10 reps - 1 sets - 30 hold - 2x daily - 7x weekly  Gastroc Stretch on Wall - 5 reps - 1 sets - 30 hold - 2x daily - 7x weekly  Soleus Stretch on Wall - 10 reps - 2 sets - 30 hold - 1x daily - 7x weekly  Standing Heel Raises - 10 reps - 2 sets - 5 hold - 2x daily - 7x weekly  Seated Ankle Eversion with Resistance - 10 reps - 2 sets - 5 hold - 2x daily - 7x weekly  Seated Ankle Inversion with Resistance and Legs Crossed - 10 reps - 2 sets - 5 hold - 2x daily - 7x weekly

## 2019-04-17 NOTE — Therapy (Signed)
[facility]patient [facility] [address] [place], [place], [address] Phone: [phone]   Fax:  [phone]  Physical Therapy Treatment  Patient Details  Name: Sandra Pineda MRN: [id number] Date of Birth: 06/01/1976 Referring Provider (PT): Gershon Mussel, MD   Encounter Date: 04/17/2019  PT End of Session - 04/17/19 1417    Visit Number  3    Number of Visits  4    Date for PT Re-Evaluation  05/08/19    Authorization Type  Medicaid    PT Start Time  1230   late 15 min    PT Stop Time  1306    PT Time Calculation (min)  36 min    Activity Tolerance  Patient tolerated treatment well    Behavior During Therapy  [facility] for tasks assessed/performed       Past Medical History:  Diagnosis Date  . Closed fracture of left lateral malleolus   . Smoker     Past Surgical History:  Procedure Laterality Date  . BREAST SURGERY    . ORIF ANKLE FRACTURE Left 11/16/2018   Procedure: OPEN REDUCTION INTERNAL FIXATION (ORIF) LEFT LATERAL MALLEOLUS, POSSIBLE SYNDESMOSIS;  Surgeon: Tarry Kos, MD;  Location: [facility];  Service: Orthopedics;  Laterality: Left;    There were no vitals filed for this visit.  Subjective Assessment - 04/17/19 1235    Subjective  Has been achy due to the weather.  Sometimes she does not have to take meds.  Swelling with activity.  Is therapy going to help me?     Pertinent History  ORIF left lateral malleolus fracture 11/16/18, tobacco use    Limitations  Standing;Walking    How long can you stand comfortably?  30 min or more     How long can you walk comfortably?  30 min     Diagnostic tests  x-rays    Patient Stated Goals  fell better     Currently in Pain?  No/denies    Pain Location  Ankle    Pain Orientation  Left    Pain Descriptors / Indicators  Aching    Pain Type  Chronic pain    Pain Onset  More than a month ago    Pain Frequency  Several days a week    Aggravating Factors   yardwork, doing  too much     Pain Relieving Factors  rest         [facility] PT Assessment - 04/17/19 1403      Assessment   Medical Diagnosis  s/p ORIF left lateral malleolus    Referring Provider (PT)  Gershon Mussel, MD    Onset Date/Surgical Date  11/16/18    Next MD Visit  unsure, was cancelled due to COVID       Precautions   Precautions  None      Restrictions   Weight Bearing Restrictions  No      Home Environment   Living Environment  Private residence    Living Arrangements  Children    Type of Home  House    Home Access  --   4 steps to front entrance   Home Layout  One level      Prior Function   Level of Independence  Independent with community mobility without device    Leisure  painting , pets       Cognition   Overall Cognitive Status  Within Functional Limits for tasks assessed  Observation/Other Assessments-Edema    Edema  --   visualized increased fullness in Rt dorsum of foot      Sensation   Light Touch  Not tested      AROM   Left Ankle Dorsiflexion  0    Left Ankle Plantar Flexion  50    Left Ankle Inversion  36    Left Ankle Eversion  22      Strength   Overall Strength  Unable to assess    Overall Strength Comments  unable to do full L ankle PF due to weakness, knee bends , appears 3+/5         [facility] Adult PT Treatment/Exercise - 04/17/19 0001      Self-Care   Self-Care  Scar Mobilizations;Posture;Other Self-Care Comments    Other Self-Care Comments   HEP, cues, POC and benefit of PT       Knee/Hip Exercises: Stretches   Gastroc Stretch  Left;3 reps    Soleus Stretch  Left;3 reps    Other Knee/Hip Stretches  wall and seated with towel       Ankle Exercises: Supine   T-Band  showed inversion, eversion redband increased time to instruct      Ankle Exercises: Standing   Heel Raises  Right;Left;Both;15 reps   double and single leg L            PT Education - 04/17/19 1417    Education Details  proper footwear, POC, HEP , therabands      Person(s) Educated  Patient    Methods  Explanation;Demonstration;Handout    Comprehension  Verbalized understanding;Returned demonstration;Verbal cues required          PT Long Term Goals - 04/17/19 1237      PT LONG TERM GOAL #1   Title  Independent with HEP    Baseline  has basic and has not been doing     Time  3    Period  Weeks    Status  On-going    Target Date  05/29/19      PT LONG TERM GOAL #2   Title  Increase left ankle dorsiflexion AROM at least 5 deg from current status to improve toe clearance with gait    Baseline  about neutral 0 deg     Time  3    Period  Weeks    Status  On-going    Target Date  05/29/19      PT LONG TERM GOAL #3   Title  Transition from use of CAM boot left LE to ASO brace for progression to independent gait without AD    Baseline  not using     Time  3    Period  Weeks    Status  Achieved    Target Date  05/29/19      PT LONG TERM GOAL #4   Title  Pt will improve SLS to about 30 sec on operated foot to demo increased balance and proprioception     Baseline  <10 sec LLE     Time  3    Period  Weeks    Status  New    Target Date  05/29/19      PT LONG TERM GOAL #5   Title  Pt will be able to mow the lawn, walk the dog without pain in ankle >2 /10 most of the time     Baseline  pain can be 5/10 with these activities  Time  6    Period  Weeks    Status  New    Target Date  05/29/19            Plan - 04/17/19 1549    Clinical Impression Statement  Patient renewed for more PT visits today as she has only had 2, clinic closed due to COVID-19 outbreak.  She has since then not seen her surgeon.  She has stopped wearing a boot or a brace. She has been faring well but still has decreased functional mobility due to ankle pain, stiffness and swellign post activity. She will benefit from continued PT to allow her full mobility with home and community activities.     Personal Factors and Comorbidities  Time since onset of  injury/illness/exacerbation;Transportation    Examination-Activity Limitations  Squat;Lift;Stairs;Stand    Examination-Participation Restrictions  Yard Work    Stability/Clinical Decision Making  Stable/Uncomplicated    Clinical Decision Making  Low    Rehab Potential  Good    PT Frequency  2x / week    PT Duration  6 weeks    PT Treatment/Interventions  ADLs/Self Care Home Management;Cryotherapy;Moist Heat;Electrical Stimulation;Gait training;Stair training;Functional mobility training;Neuromuscular re-education;Balance training;Therapeutic exercise;Therapeutic activities;Patient/family education;Manual techniques;Passive range of motion;Taping;Vasopneumatic Device    PT Next Visit Plan  ankle ROM, standing, balance.  Tele for now.     PT Home Exercise Plan  ankle alphabet, longsitting gastroc stretch, Theraband ankle 4-way, standing heel raise, SLS , wall for ankle ROM     Consulted and Agree with Plan of Care  Patient       Patient will benefit from skilled therapeutic intervention in order to improve the following deficits and impairments:  Decreased balance, Decreased mobility, Hypomobility, Increased edema, Decreased range of motion, Decreased activity tolerance, Decreased strength, Increased fascial restricitons, Pain  Visit Diagnosis: Pain in left ankle and joints of left foot  Stiffness of left ankle, not elsewhere classified  Muscle weakness (generalized)  Difficulty in walking, not elsewhere classified  Localized edema     Problem List Patient Active Problem List   Diagnosis Date Noted  . Gynecologic exam normal 02/01/2019  . Screening mammogram, encounter for 02/01/2019  . Pelvic pain 02/01/2019  . Closed fracture of left lateral malleolus 11/15/2018    , 04/17/2019, 3:53 PM  [facility] [address] [place], [place], [address] Phone: [phone]   Fax:  [phone]  Name: Sandra Pineda MRN:  [id number] Date of Birth: 11-Jan-1976  Karie Mainland, PT 04/17/19 3:55 PM Phone: [phone] Fax: [phone]

## 2019-04-25 ENCOUNTER — Encounter: Payer: Self-pay | Admitting: Physical Therapy

## 2019-04-25 ENCOUNTER — Ambulatory Visit: Payer: Medicaid Other | Admitting: Physical Therapy

## 2019-04-25 DIAGNOSIS — M25672 Stiffness of left ankle, not elsewhere classified: Secondary | ICD-10-CM

## 2019-04-25 DIAGNOSIS — M25572 Pain in left ankle and joints of left foot: Secondary | ICD-10-CM | POA: Diagnosis not present

## 2019-04-25 DIAGNOSIS — M6281 Muscle weakness (generalized): Secondary | ICD-10-CM

## 2019-04-25 DIAGNOSIS — R600 Localized edema: Secondary | ICD-10-CM

## 2019-04-25 DIAGNOSIS — R262 Difficulty in walking, not elsewhere classified: Secondary | ICD-10-CM

## 2019-04-25 NOTE — Therapy (Signed)
[facility] [address] [place], [place], [address] Phone: [phone]   Fax:  [phone]  Physical Therapy Treatment  Patient Details  Name: Sandra Pineda MRN: [id number] Date of Birth: 08-22-1976 Referring Provider (PT): Gershon MusselNaiping Xu, MD   Encounter Date: 04/25/2019  PT End of Session - 04/25/19 1552    Visit Number  4    Authorization - Visit Number  1    Authorization - Number of Visits  12    PT Start Time  1545    PT Stop Time  1626    PT Time Calculation (min)  41 min    Activity Tolerance  Patient tolerated treatment well    Behavior During Therapy  [facility] for tasks assessed/performed       Past Medical History:  Diagnosis Date  . Closed fracture of left lateral malleolus   . Smoker     Past Surgical History:  Procedure Laterality Date  . BREAST SURGERY    . ORIF ANKLE FRACTURE Left 11/16/2018   Procedure: OPEN REDUCTION INTERNAL FIXATION (ORIF) LEFT LATERAL MALLEOLUS, POSSIBLE SYNDESMOSIS;  Surgeon: Tarry KosXu, Naiping M, MD;  Location: [facility];  Service: Orthopedics;  Laterality: Left;    There were no vitals filed for this visit.  Subjective Assessment - 04/25/19 1551    Subjective  Pt without pain today.  Swelling at times.      Currently in Pain?  No/denies         [facility]/Exercise - 04/25/19 0001      Knee/Hip Exercises: Standing   Heel Raises  Both;3 sets;10 reps    Heel Raises Limitations  varied positions     Functional Squat  2 sets;10 reps    Functional Squat Limitations  shallow     SLS  10 sec or less x 5 trials       Ankle Exercises: Supine   T-Band  eversion and inversion red band x 20 each max cues       Ankle Exercises: Standing   SLS  with single leg shallow knee bends LLE       Ankle Exercises: Stretches   Soleus Stretch  3 reps;30 seconds    Gastroc Stretch  3 reps;30 seconds    Other Stretch  manual PROM by patient , rotation, toe extension     video demo via Med bridge      Ankle Exercises: Seated   Towel Crunch  --   x 10    Towel Inversion/Eversion  3 reps   max cues                  PT Long Term Goals - 04/25/19 1559      PT LONG TERM GOAL #1   Title  Independent with HEP    Baseline  moderate compliance     Status  On-going      PT LONG TERM GOAL #2   Title  Increase left ankle dorsiflexion AROM at least 5 deg from current status to improve toe clearance with gait    Status  On-going      PT LONG TERM GOAL #3   Title  Transition from use of CAM boot left LE to ASO brace for progression to independent gait without AD    Status  Achieved      PT LONG TERM GOAL #4   Title  Pt will improve SLS to about 30 sec on operated foot to demo increased  balance and proprioception     Baseline  10 sec today     Status  On-going      PT LONG TERM GOAL #5   Title  Pt will be able to mow the lawn, walk the dog without pain in ankle >2 /10 most of the time     Status  On-going            Plan - 04/25/19 1556    Clinical Impression Statement  Pt needed increased cueing today for HEP.  Seems to have only been working on SLS time.  Goals in progress     Personal Factors and Comorbidities  Time since onset of injury/illness/exacerbation;Transportation    Examination-Activity Limitations  Squat;Lift;Stairs;Stand    Examination-Participation Restrictions  Yard Work    PT Treatment/Interventions  ADLs/Self Care Home Management;Cryotherapy;Moist Heat;Electrical Stimulation;Gait training;Stair training;Functional mobility training;Neuromuscular re-education;Balance training;Therapeutic exercise;Therapeutic activities;Patient/family education;Manual techniques;Passive range of motion;Taping;Vasopneumatic Device    PT Next Visit Plan  ankle ROM, standing, balance.  Tele for now.     PT Home Exercise Plan  ankle alphabet, longsitting gastroc stretch, Theraband ankle 4-way, standing heel raise, SLS , wall for ankle ROM      Consulted and Agree with Plan of Care  Patient       Patient will benefit from skilled therapeutic intervention in order to improve the following deficits and impairments:  Decreased balance, Decreased mobility, Hypomobility, Increased edema, Decreased range of motion, Decreased activity tolerance, Decreased strength, Increased fascial restricitons, Pain  Visit Diagnosis: Pain in left ankle and joints of left foot  Stiffness of left ankle, not elsewhere classified  Muscle weakness (generalized)  Difficulty in walking, not elsewhere classified  Localized edema     Problem List Patient Active Problem List   Diagnosis Date Noted  . Gynecologic exam normal 02/01/2019  . Screening mammogram, encounter for 02/01/2019  . Pelvic pain 02/01/2019  . Closed fracture of left lateral malleolus 11/15/2018    PAA,JENNIFER 04/25/2019, 4:29 PM  [facility] [address] [place], [place], [address] Phone: [phone]   Fax:  [phone]  Name: Sandra Pineda MRN: [id number] Date of Birth: [birth year]  Karie Mainland, PT 04/25/19 4:30 PM Phone: [phone] Fax: [phone]

## 2019-04-27 ENCOUNTER — Ambulatory Visit: Payer: Medicaid Other | Admitting: Physical Therapy

## 2019-05-02 ENCOUNTER — Ambulatory Visit: Payer: Medicaid Other | Attending: Orthopaedic Surgery | Admitting: Physical Therapy

## 2019-05-02 DIAGNOSIS — R600 Localized edema: Secondary | ICD-10-CM

## 2019-05-02 DIAGNOSIS — M25672 Stiffness of left ankle, not elsewhere classified: Secondary | ICD-10-CM | POA: Diagnosis present

## 2019-05-02 DIAGNOSIS — M25572 Pain in left ankle and joints of left foot: Secondary | ICD-10-CM | POA: Diagnosis not present

## 2019-05-02 DIAGNOSIS — R262 Difficulty in walking, not elsewhere classified: Secondary | ICD-10-CM

## 2019-05-02 DIAGNOSIS — M6281 Muscle weakness (generalized): Secondary | ICD-10-CM

## 2019-05-02 NOTE — Patient Instructions (Signed)
Access Code: [email or web address]  URL: [email or web address]  Date: 05/02/2019  Prepared by: Karie Mainland   Exercises  Standing Heel Raise - 10 reps - 2 sets - 1x daily - 7x weekly  Squat - 10 reps - 2 sets - 5 hold - 1x daily - 7x weekly  Mini Lunge - 10 reps - 2 sets - 5 hold - 1x daily - 7x weekly

## 2019-05-02 NOTE — Therapy (Addendum)
[place], [place], [address] Phone: [phone]   Fax:  [phone]  Physical Therapy Treatment/Discharge   Patient Details  Name: Sandra Pineda MRN: [id number] Date of Birth: May 28, 1976 Referring Provider (PT): Frankey Shown, MD   Physical Therapy Telehealth Visit:  I connected with Bonnielee Haff at 3:30 by [facility] video conference and verified that I am speaking with the correct person using two identifiers.  I discussed the limitations, risks, security and privacy concerns of performing an evaluation and management service by Webex and the availability of in person appointments.  I also discussed with the patient that there may be a patient responsible charge related to this service. The patient expressed understanding and agreed to proceed.    The patient's address was confirmed.  Identified to the patient that therapist is a licensed PT in the state of [place].  Verified phone # as [phone]   to call in case of technical difficulties.    Encounter Date: 05/02/2019  PT End of Session - 05/02/19 1533    Visit Number  5    Date for PT Re-Evaluation  05/08/19    Authorization - Visit Number  2    Authorization - Number of Visits  12    PT Start Time  4765    PT Stop Time  4650    PT Time Calculation (min)  44 min    Activity Tolerance  Patient tolerated treatment well    Behavior During Therapy  WFL for tasks assessed/performed       Past Medical History:  Diagnosis Date  . Closed fracture of left lateral malleolus   . Smoker     Past Surgical History:  Procedure Laterality Date  . BREAST SURGERY    . ORIF ANKLE FRACTURE Left 11/16/2018   Procedure: OPEN REDUCTION INTERNAL FIXATION (ORIF) LEFT LATERAL MALLEOLUS, POSSIBLE SYNDESMOSIS;  Surgeon: Leandrew Koyanagi, MD;  Location: [place];  Service: Orthopedics;  Laterality: Left;    There were no vitals filed for this  visit.  Subjective Assessment - 05/02/19 1535    Subjective  Pain with certain exercises , bands moving inward.      How long can you walk comfortably?  can do 30 min     Currently in Pain?  No/denies    Pain Score  0-No pain    Pain Location  Ankle    Pain Orientation  Left    Pain Type  Chronic pain    Pain Onset  More than a month ago    Pain Frequency  Several days a week    Aggravating Factors   standing     Pain Relieving Factors  rest    Effect of Pain on Daily Activities  cant walk like she wants to          [facility] PT Assessment - 05/02/19 0001      AROM   Overall AROM Comments  limited in inversion and eversion but not measured          [facility] Adult PT Treatment/Exercise - 05/02/19 0001      Self-Care   Other Self-Care Comments   progress, POC       Knee/Hip Exercises: Standing   Heel Raises  Right;Left;1 set;20 reps    Heel Raises Limitations  x 10 with turnout     Hip Flexion  Stengthening;1 set    Hip Flexion Limitations  x 10 in L SLS  Forward Lunges  Both;1 set;10 reps    Forward Lunges Limitations  good aligment     Side Lunges  Both;1 set;10 reps    Side Lunges Limitations  poor form     Functional Squat  1 set;15 reps      Ankle Exercises: Seated   Ankle Circles/Pumps  AROM;Left;10 reps    Towel Crunch  5 reps   x 2 seated and standing    Towel Inversion/Eversion  3 reps   inv and eversion x 10 long sitting      Ankle Exercises: Stretches   Gastroc Stretch  2 reps;30 seconds      Ankle Exercises: Standing   SLS  static holds multi trials     Balance Beam  4 x 10 feet     Other Standing Ankle Exercises  static tandem then torso twists  each side, added arms overhead with eyes closed x 10 sec              PT Education - 05/02/19 1614    Education Details  new upgraded HEP, POC , progress and function    Person(s) Educated  Patient    Methods  Explanation;Handout    Comprehension  Verbalized understanding;Returned demonstration           PT Long Term Goals - 05/02/19 1615      PT LONG TERM GOAL #1   Title  Independent with HEP    Baseline  good compliance but does not all     Status  Partially Met      PT LONG TERM GOAL #2   Title  Increase left ankle dorsiflexion AROM at least 5 deg from current status to improve toe clearance with gait    Status  Unable to assess      PT LONG TERM GOAL #3   Title  Transition from use of CAM boot left LE to ASO brace for progression to independent gait without AD    Status  Achieved      PT LONG TERM GOAL #4   Title  Pt will improve SLS to about 30 sec on operated foot to demo increased balance and proprioception     Status  On-going      PT LONG TERM GOAL #5   Title  Pt will be able to mow the lawn, walk the dog without pain in ankle >2 /10 most of the time     Status  Partially Met            Plan - 05/02/19 1616    Clinical Impression Statement  Pt is doing well, now having pain with standin exercises today.  She does not do her exercises when it hurts .  Still has trouble with uneven ground (grass) and is working on balance.  Will only need a couple more visits to challenge her with standing ex.     PT Treatment/Interventions  ADLs/Self Care Home Management;Cryotherapy;Moist Heat;Electrical Stimulation;Gait training;Stair training;Functional mobility training;Neuromuscular re-education;Balance training;Therapeutic exercise;Therapeutic activities;Patient/family education;Manual techniques;Passive range of motion;Taping;Vasopneumatic Device    PT Next Visit Plan  ankle ROM, standing, balance.  Tele for now.     PT Home Exercise Plan  ankle alphabet, longsitting gastroc stretch, Theraband ankle 4-way, standing heel raise, SLS , wall for ankle ROM . mini lunge, squat and heel raises     Consulted and Agree with Plan of Care  Patient       Patient will benefit from skilled therapeutic intervention in order  to improve the following deficits and impairments:   Decreased balance, Decreased mobility, Hypomobility, Increased edema, Decreased range of motion, Decreased activity tolerance, Decreased strength, Increased fascial restricitons, Pain  Visit Diagnosis: Pain in left ankle and joints of left foot  Stiffness of left ankle, not elsewhere classified  Muscle weakness (generalized)  Difficulty in walking, not elsewhere classified  Localized edema     Problem List Patient Active Problem List   Diagnosis Date Noted  . Gynecologic exam normal 02/01/2019  . Screening mammogram, encounter for 02/01/2019  . Pelvic pain 02/01/2019  . Closed fracture of left lateral malleolus 11/15/2018    PAA,JENNIFER 05/02/2019, 4:22 PM  [facility] [address] [place], [place], [address] Phone: [phone]   Fax:  [phone]  Name: Allanna Bresee MRN: [id number] Date of Birth: 1976/06/05  Raeford Razor, PT 05/02/19 4:23 PM Phone: [phone] Fax: [phone]   PHYSICAL THERAPY DISCHARGE SUMMARY  Visits from Start of Care: 5  Current functional level related to goals / functional outcomes: Unknown, see above for most recent info    Remaining deficits: Unknown, see above    Education / Equipment: HEP, standing activities  Plan: Patient agrees to discharge.  Patient goals were partially met. Patient is being discharged due to not returning since the last visit.  ?????    Raeford Razor, PT 09/27/19 2:16 PM Phone: [phone] Fax: [phone]

## 2019-05-04 ENCOUNTER — Ambulatory Visit: Payer: Medicaid Other | Admitting: Physical Therapy

## 2019-05-23 ENCOUNTER — Ambulatory Visit: Payer: Medicaid Other

## 2020-01-01 IMAGING — CR DG ANKLE COMPLETE 3+V*R*
3 series · 3 of 3 positions shown · non-contrast
Comparison: None.

CLINICAL DATA: Fell yesterday with ankle pain and swelling

EXAM:
RIGHT ANKLE - COMPLETE 3+ VIEW

[x ankle ap right]
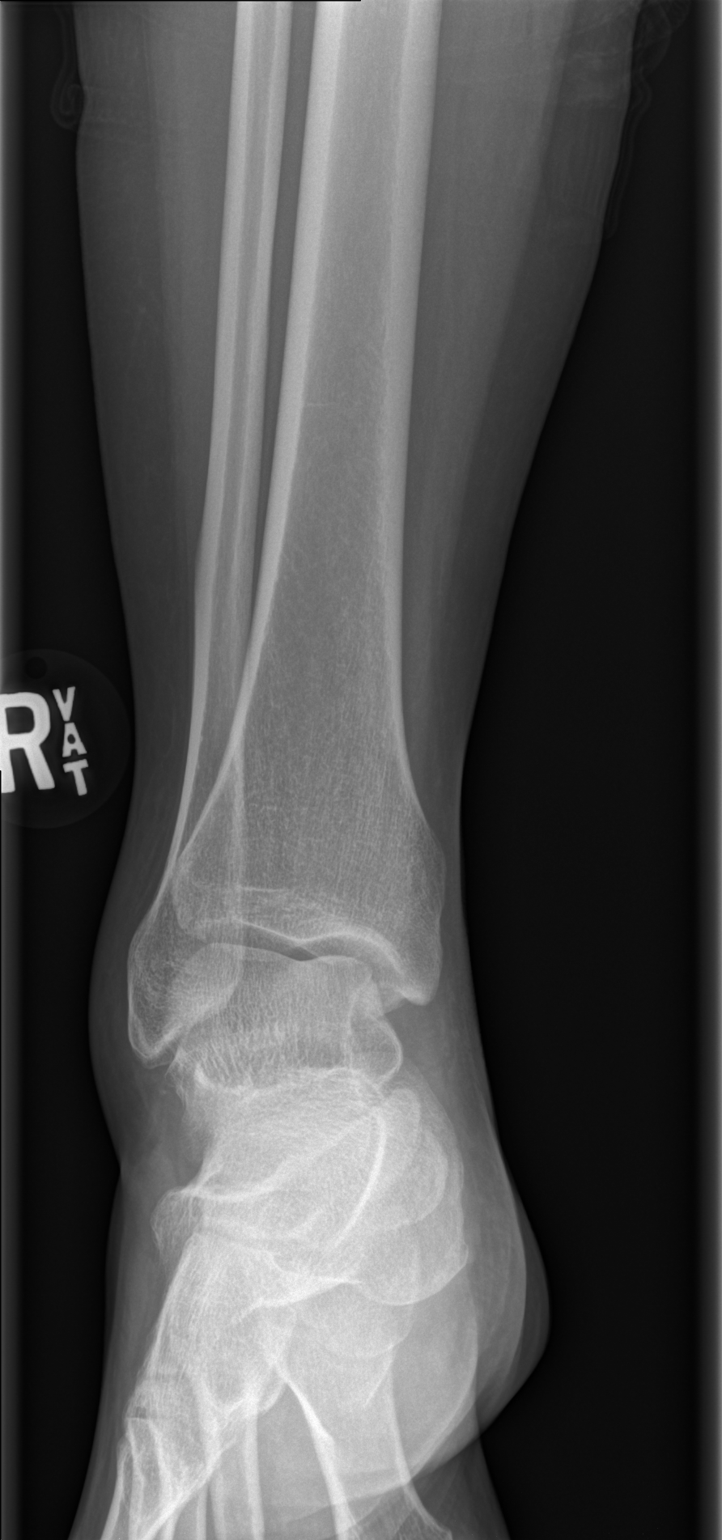

[x ankle obl right]
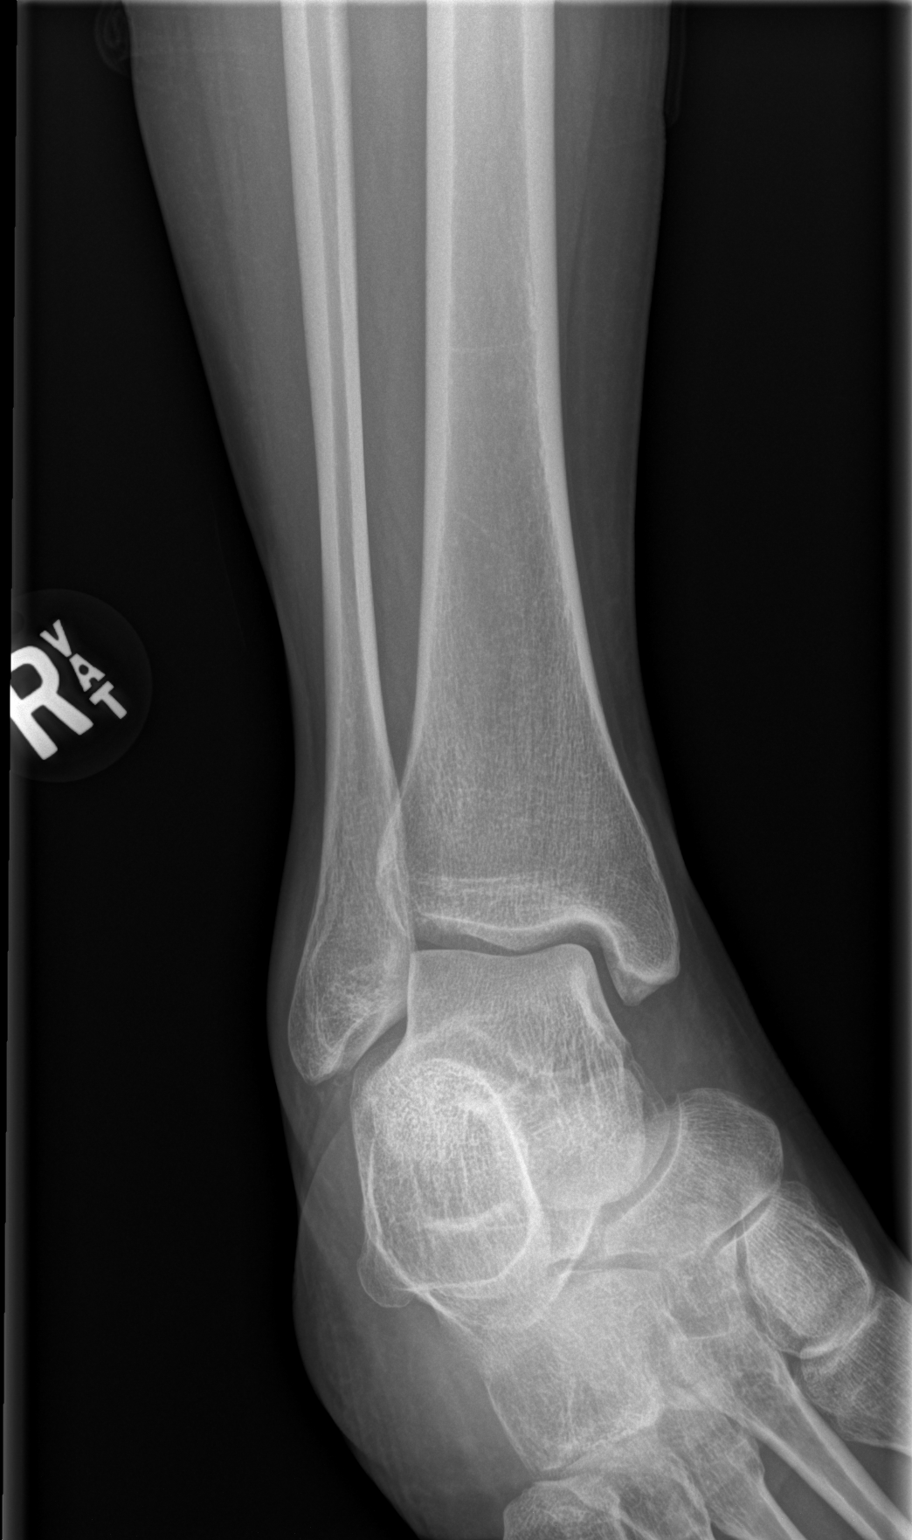

[x ankle lat right]
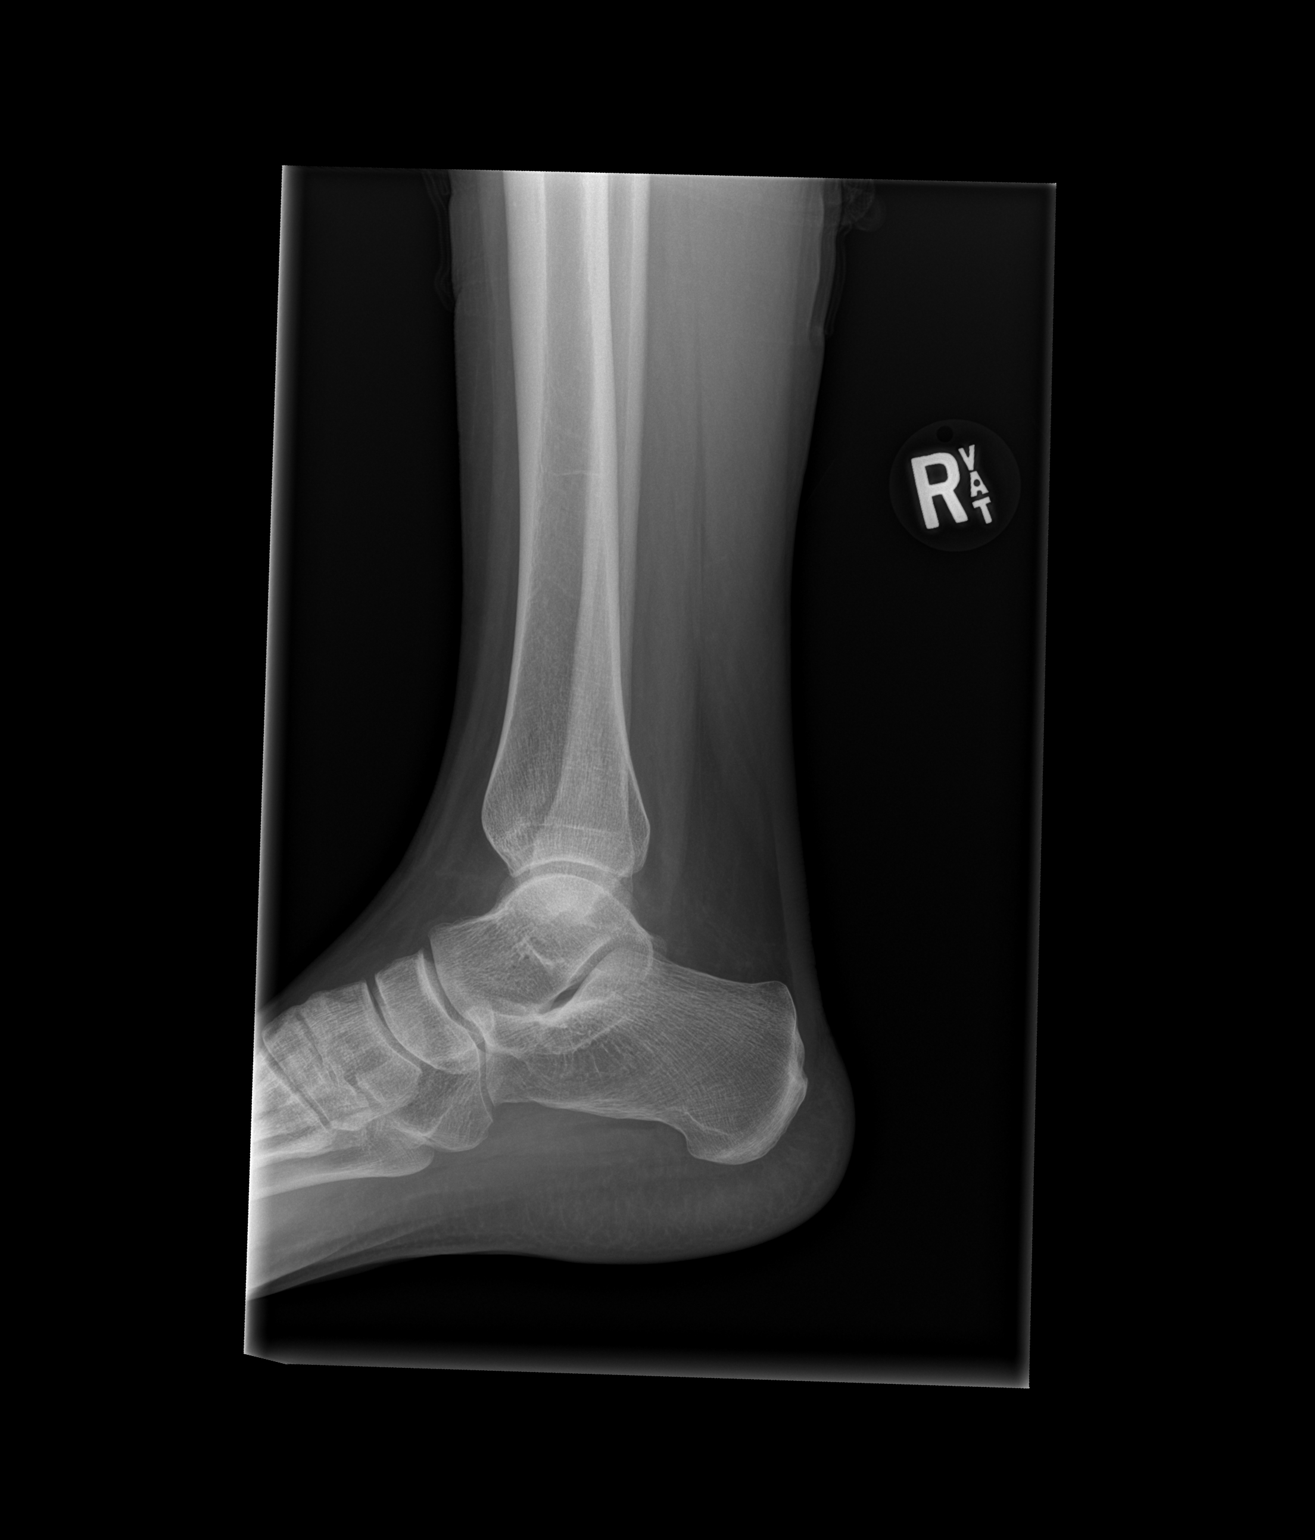

[3 of 3 positions shown; findings below may reference images not displayed]

FINDINGS: The right ankle joint appears normal with normal alignment and
normal tibiotalar articulation. No acute fracture is seen. No
significant soft tissue swelling is noted.
IMPRESSION: Negative.

## 2020-01-01 IMAGING — CR DG FOOT COMPLETE 3+V*L*
3 series · 3 of 3 positions shown · non-contrast
Comparison: None.

CLINICAL DATA: 42 y.o female. Per EMS, patient from home, reports
trip and fall yesterday after missing a step. C/o left and right
ankle pain. Swelling noted to left ankle and bruising noted to
lateral right malleolus swelling

EXAM:
LEFT FOOT - COMPLETE 3+ VIEW

[x foot ap left]
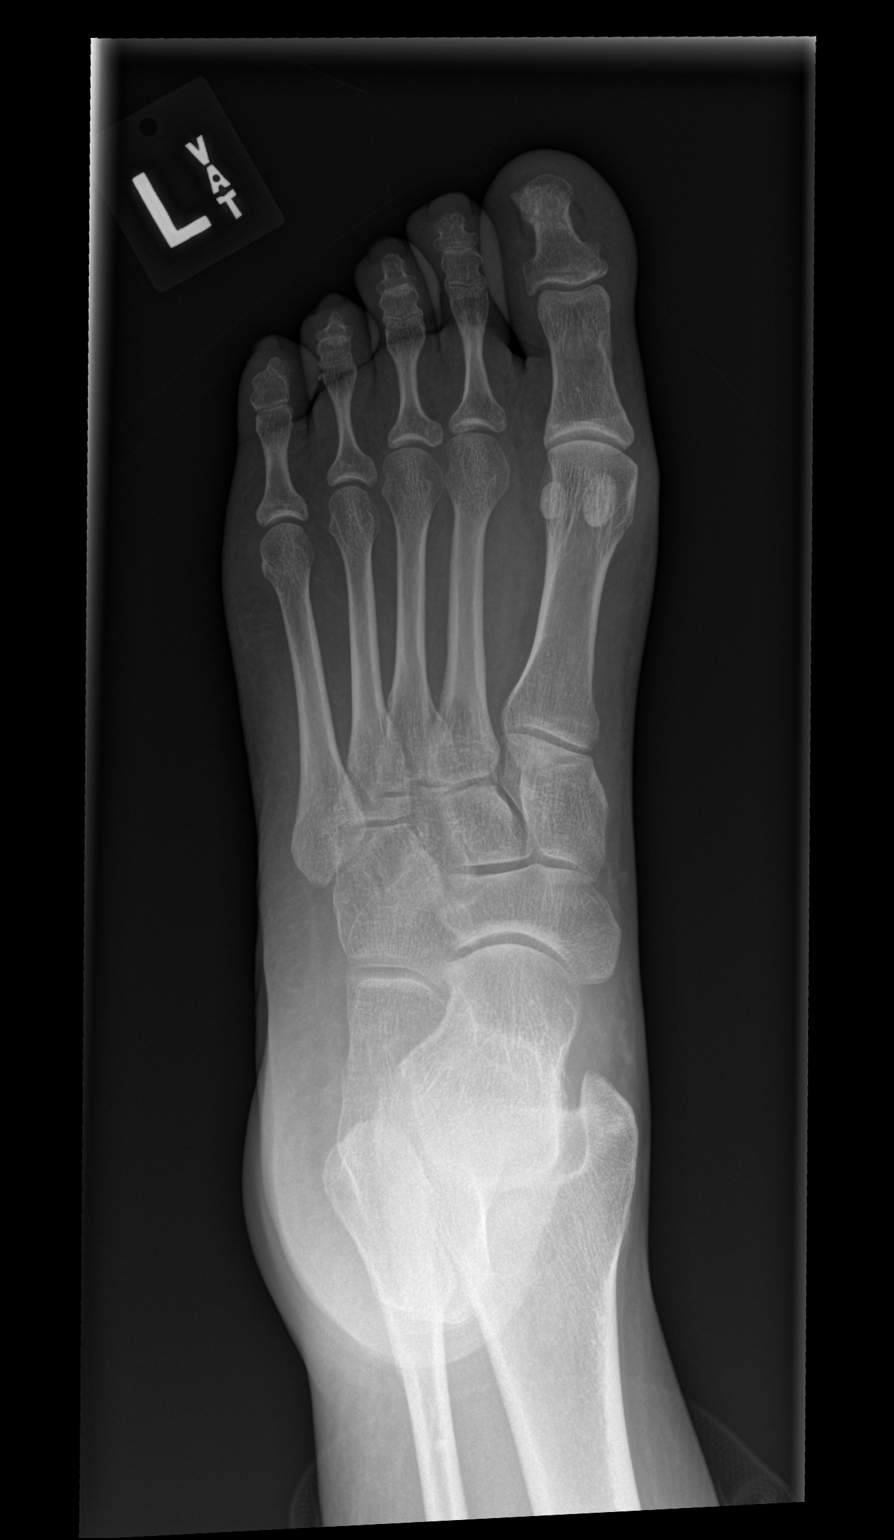

[x foot obl left]
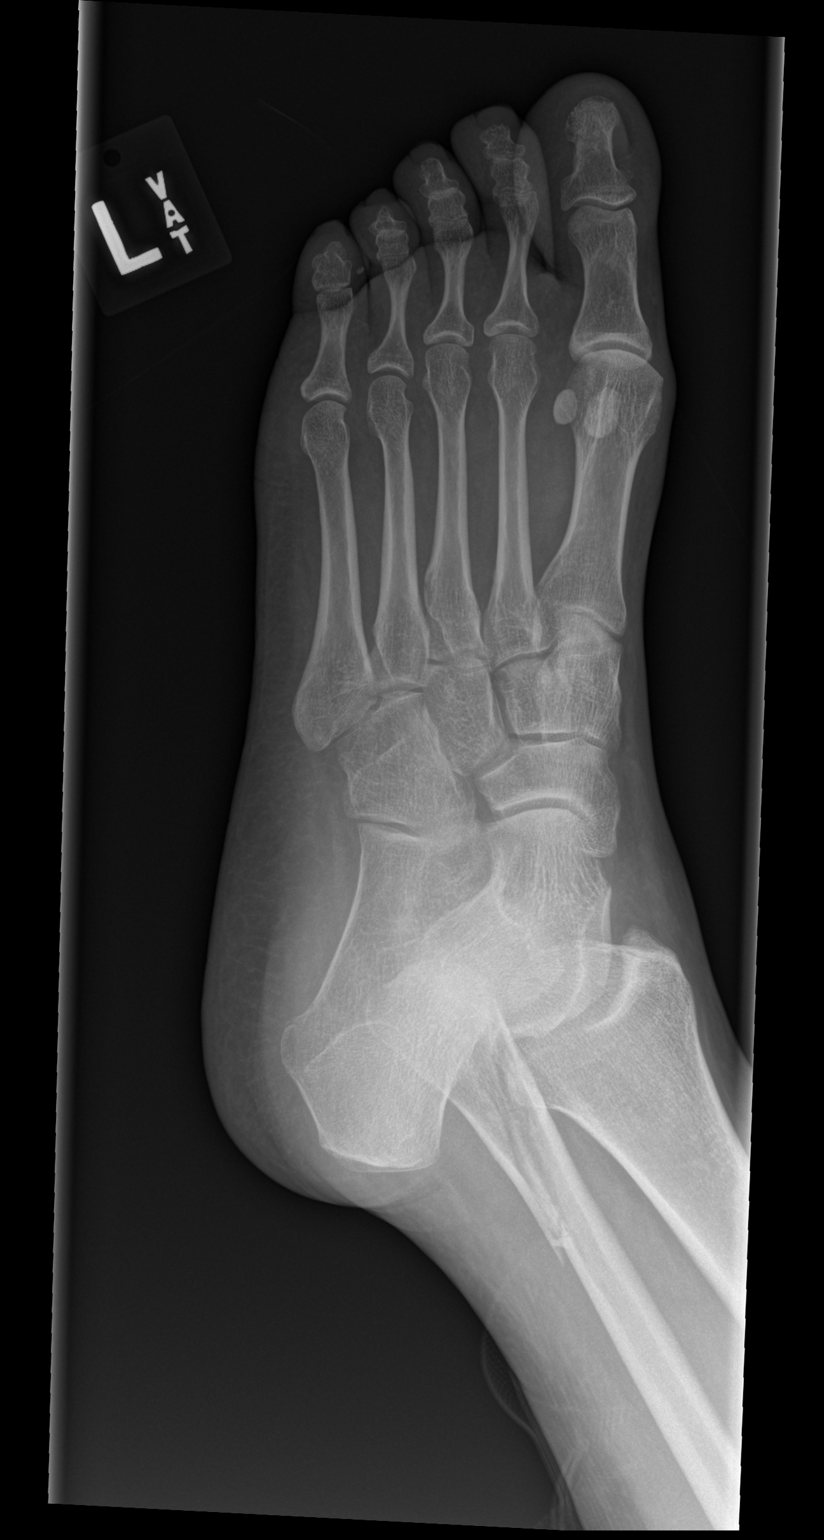

[x foot lat left]
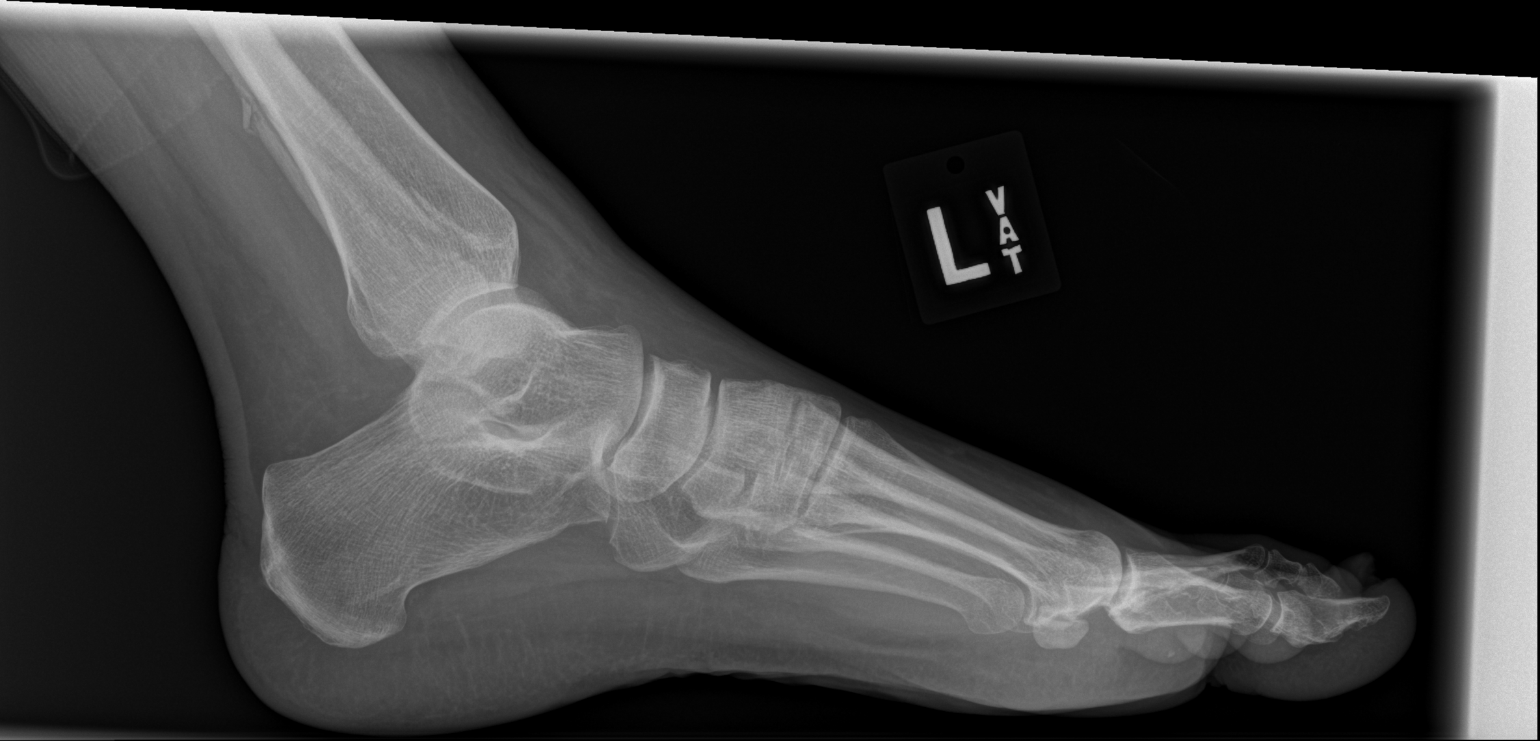

[3 of 3 positions shown; findings below may reference images not displayed]

FINDINGS: No fracture or dislocation of mid foot or forefoot. The phalanges
are normal. The calcaneus is normal. No soft tissue abnormality.

Fracture of the distal fibula.
IMPRESSION: 1. No foot fracture.
2. Distal fibular fracture.  See ankle film dictation.

## 2020-04-12 ENCOUNTER — Ambulatory Visit: Payer: Medicaid Other | Admitting: Orthopaedic Surgery

## 2020-05-09 ENCOUNTER — Ambulatory Visit: Payer: Medicaid Other | Admitting: Orthopaedic Surgery

## 2023-09-20 ENCOUNTER — Ambulatory Visit: Payer: 59 | Admitting: Podiatry

## 2023-09-29 ENCOUNTER — Ambulatory Visit (INDEPENDENT_AMBULATORY_CARE_PROVIDER_SITE_OTHER): Payer: 59 | Admitting: Podiatry

## 2023-09-29 DIAGNOSIS — Z91199 Patient's noncompliance with other medical treatment and regimen due to unspecified reason: Secondary | ICD-10-CM

## 2023-09-29 NOTE — Progress Notes (Signed)
No show

## 2023-12-13 DIAGNOSIS — Z0000 Encounter for general adult medical examination without abnormal findings: Secondary | ICD-10-CM | POA: Diagnosis not present

## 2023-12-13 DIAGNOSIS — Z13220 Encounter for screening for lipoid disorders: Secondary | ICD-10-CM | POA: Diagnosis not present

## 2023-12-13 DIAGNOSIS — E559 Vitamin D deficiency, unspecified: Secondary | ICD-10-CM | POA: Diagnosis not present
